# Patient Record
Sex: Female | Born: 1995 | Race: White | Hispanic: No | Marital: Single | Smoking: Former smoker
Health system: Southern US, Community
[De-identification: ages and names within clinical notes are randomized; demographics above are authoritative.]

## PROBLEM LIST (undated history)

## (undated) ENCOUNTER — Inpatient Hospital Stay: Payer: Self-pay

## (undated) DIAGNOSIS — F32A Depression, unspecified: Secondary | ICD-10-CM

## (undated) DIAGNOSIS — F329 Major depressive disorder, single episode, unspecified: Secondary | ICD-10-CM

## (undated) DIAGNOSIS — F419 Anxiety disorder, unspecified: Secondary | ICD-10-CM

## (undated) HISTORY — PX: NO PAST SURGERIES: SHX2092

---

## 2014-05-13 NOTE — L&D Delivery Note (Signed)
Delivery Note At 3:26 PM a viable female was delivered via Vaginal, Spontaneous Delivery (Presentation: ; Occiput Anterior).  APGAR: 9, 9; weight pending .   Placenta status: Intact, Spontaneous.  Cord: 3 vessels with the following complications: none.  Cord pH: not obtained  Anesthesia: Epidural  Episiotomy: None Lacerations: 2nd degree Suture Repair: 3.0 vicryl Est. Blood Loss (mL): 400  Mom to postpartum.  Baby to Couplet care / Skin to Skin.  Called to see patient.  Mom pushed to delivery viable female infant.  The head followed by shoulders, which delivered without difficulty, and the rest of the body.  No nuchal cord noted.  Baby to mom's chest.  Cord clamped and cut after > 1 min delay.  Cord blood obtained.  Placenta delivered spontaneously, intact, with a 3-vessel cord.  Second degree vaginal/perineal laceration repaired with 3-0 Vicryl in standard fashion.  All counts correct.  Hemostasis obtained with IV pitocin and fundal massage. EBL 400 mL.   Girl name is Kristen Hughes.   Conard Novak, MD 12/22/2014, 3:46 PM

## 2014-05-25 LAB — OB RESULTS CONSOLE ABO/RH: RH TYPE: POSITIVE

## 2014-05-25 LAB — OB RESULTS CONSOLE GC/CHLAMYDIA
CHLAMYDIA, DNA PROBE: NEGATIVE
Gonorrhea: NEGATIVE

## 2014-05-25 LAB — OB RESULTS CONSOLE RUBELLA ANTIBODY, IGM: Rubella: IMMUNE

## 2014-05-25 LAB — OB RESULTS CONSOLE HEPATITIS B SURFACE ANTIGEN: Hepatitis B Surface Ag: NEGATIVE

## 2014-05-25 LAB — OB RESULTS CONSOLE VARICELLA ZOSTER ANTIBODY, IGG: Varicella: IMMUNE

## 2014-05-25 LAB — OB RESULTS CONSOLE HIV ANTIBODY (ROUTINE TESTING): HIV: NONREACTIVE

## 2014-05-25 LAB — OB RESULTS CONSOLE RPR: RPR: NONREACTIVE

## 2014-05-31 ENCOUNTER — Ambulatory Visit: Payer: Self-pay | Admitting: Advanced Practice Midwife

## 2014-09-29 ENCOUNTER — Inpatient Hospital Stay
Admission: EM | Admit: 2014-09-29 | Discharge: 2014-09-30 | DRG: 778 | Disposition: A | Discharge status: Other Acute Inpatient Hospital | Payer: 59 | Attending: Obstetrics and Gynecology | Admitting: Obstetrics and Gynecology

## 2014-09-29 DIAGNOSIS — O4700 False labor before 37 completed weeks of gestation, unspecified trimester: Secondary | ICD-10-CM | POA: Diagnosis present

## 2014-09-29 DIAGNOSIS — O479 False labor, unspecified: Secondary | ICD-10-CM | POA: Diagnosis present

## 2014-09-29 DIAGNOSIS — O99332 Smoking (tobacco) complicating pregnancy, second trimester: Secondary | ICD-10-CM | POA: Diagnosis present

## 2014-09-29 DIAGNOSIS — F17210 Nicotine dependence, cigarettes, uncomplicated: Secondary | ICD-10-CM | POA: Diagnosis present

## 2014-09-29 DIAGNOSIS — Z880 Allergy status to penicillin: Secondary | ICD-10-CM | POA: Diagnosis not present

## 2014-09-29 DIAGNOSIS — Z3A27 27 weeks gestation of pregnancy: Secondary | ICD-10-CM | POA: Diagnosis present

## 2014-09-29 DIAGNOSIS — O4703 False labor before 37 completed weeks of gestation, third trimester: Secondary | ICD-10-CM

## 2014-09-29 DIAGNOSIS — Z3490 Encounter for supervision of normal pregnancy, unspecified, unspecified trimester: Secondary | ICD-10-CM

## 2014-09-29 LAB — FETAL FIBRONECTIN: Fetal Fibronectin: NEGATIVE

## 2014-09-29 LAB — URINALYSIS COMPLETE WITH MICROSCOPIC (ARMC ONLY)
Bacteria, UA: NONE SEEN
Bilirubin Urine: NEGATIVE
Glucose, UA: NEGATIVE mg/dL
Hgb urine dipstick: NEGATIVE
NITRITE: NEGATIVE
PH: 6 (ref 5.0–8.0)
Protein, ur: NEGATIVE mg/dL
SPECIFIC GRAVITY, URINE: 1.008 (ref 1.005–1.030)

## 2014-09-29 LAB — CBC WITH DIFFERENTIAL/PLATELET
BASOS ABS: 0 10*3/uL (ref 0–0.1)
BASOS PCT: 0 %
Eosinophils Absolute: 0.2 10*3/uL (ref 0–0.7)
Eosinophils Relative: 1 %
HCT: 39.5 % (ref 35.0–47.0)
Hemoglobin: 13 g/dL (ref 12.0–16.0)
LYMPHS PCT: 14 %
Lymphs Abs: 2.1 10*3/uL (ref 1.0–3.6)
MCH: 31.3 pg (ref 26.0–34.0)
MCHC: 32.9 g/dL (ref 32.0–36.0)
MCV: 95.2 fL (ref 80.0–100.0)
Monocytes Absolute: 1 10*3/uL — ABNORMAL HIGH (ref 0.2–0.9)
Monocytes Relative: 7 %
NEUTROS ABS: 11.4 10*3/uL — AB (ref 1.4–6.5)
NEUTROS PCT: 78 %
Platelets: 224 10*3/uL (ref 150–440)
RBC: 4.15 MIL/uL (ref 3.80–5.20)
RDW: 13 % (ref 11.5–14.5)
WBC: 14.7 10*3/uL — AB (ref 3.6–11.0)

## 2014-09-29 LAB — CHLAMYDIA/NGC RT PCR (ARMC ONLY)
Chlamydia Tr: NOT DETECTED
N gonorrhoeae: NOT DETECTED

## 2014-09-29 MED ORDER — PRENATAL MULTIVITAMIN CH
1.0000 | ORAL_TABLET | Freq: Every day | ORAL | Status: DC
  Filled 2014-09-29 (×2): qty 1

## 2014-09-29 MED ORDER — BETAMETHASONE SOD PHOS & ACET 6 (3-3) MG/ML IJ SUSP
12.0000 mg | Freq: Every day | INTRAMUSCULAR | Status: DC
  Administered 2014-09-29: 12 mg via INTRAMUSCULAR
  Filled 2014-09-29: qty 2

## 2014-09-29 MED ORDER — CITRIC ACID-SODIUM CITRATE 334-500 MG/5ML PO SOLN: 30.0000 mL | ORAL | Status: DC | PRN

## 2014-09-29 MED ORDER — LACTATED RINGERS IV BOLUS (SEPSIS)
1000.0000 mL | Freq: Once | INTRAVENOUS | Status: AC
  Administered 2014-09-29: 1000 mL via INTRAVENOUS

## 2014-09-29 MED ORDER — LACTATED RINGERS IV SOLN
INTRAVENOUS | Status: DC
  Administered 2014-09-29 – 2014-09-30 (×2): via INTRAVENOUS

## 2014-09-29 MED ORDER — LIDOCAINE HCL (PF) 1 % IJ SOLN
30.0000 mL | INTRAMUSCULAR | Status: DC | PRN
  Filled 2014-09-29: qty 30

## 2014-09-29 MED ORDER — ONDANSETRON HCL 4 MG/2ML IJ SOLN: 4.0000 mg | Freq: Four times a day (QID) | INTRAMUSCULAR | Status: DC | PRN

## 2014-09-29 MED ORDER — BETAMETHASONE SOD PHOS & ACET 6 (3-3) MG/ML IJ SUSP
INTRAMUSCULAR | Status: AC
  Administered 2014-09-29: 12 mg via INTRAMUSCULAR
  Filled 2014-09-29: qty 1

## 2014-09-29 MED ORDER — ACETAMINOPHEN 325 MG PO TABS: 650.0000 mg | ORAL_TABLET | ORAL | Status: DC | PRN

## 2014-09-29 NOTE — H&P (Signed)
Obstetrics Admission History & Physical  Primary OBGYN: [facility]  Chief Complaint:  UCs  History of Present Illness  19 y.o. G1P0 @ 3474w2d (Dating: 9wk u/s, EDC 8/16 per patient). Pregnancy complicated by: 1/4 ppd tobacco use.  Ms. Sherre PootLogan Briana Cange states that starting today she started noticing belly cramping that turned into q3-4531m UCs, which is what brought her in. No VB, LOF, decreased FM. Last anything in the vagina in the past two days was a q-tip (no speculum) at her PNV yesterday for screening STIs. Also no fevers, dysuria  Review of Systems:  her 12 point review of systems is negative or as noted in the History of Present Illness.  PMHx: History reviewed. No pertinent past medical history. PSHx: History reviewed. No pertinent past surgical history. Medications:  No prescriptions prior to admission     Allergies: is allergic to amoxicillin.-->hives OBHx: as above GYNHx:            FHx: History reviewed. No pertinent family history. Soc Hx:  History   Social History  . Marital Status: Single    Spouse Name: N/A  . Number of Children: N/A  . Years of Education: N/A   Occupational History  . Not on file.   Social History Main Topics  . Smoking status: Current Every Day Smoker -- 0.25 packs/day for 5 years    Types: Cigarettes  . Smokeless tobacco: Never Used  . Alcohol Use: No  . Drug Use: No  . Sexual Activity: Yes   Other Topics Concern  . Not on file   Social History Narrative  . No narrative on file    Objective    Current Vital Signs 24h Vital Sign Ranges  T 98.9 F (37.2 C) Temp  Avg: 98.9 F (37.2 C)  Min: 98.9 F (37.2 C)  Max: 98.9 F (37.2 C)  BP 113/67 mmHg BP  Min: 113/67  Max: 113/67  HR 89 Pulse  Avg: 89  Min: 89  Max: 89  RR 18 Resp  Avg: 18  Min: 18  Max: 18  SaO2    >98/RA No Data Recorded       24 Hour I/O Current Shift I/O  Time Ins Outs       EFM: 145 baseline, +accels, no decels (1712 was during speculum exam), mod var Toco:  q1-3036m  General: Well nourished, well developed female in no acute distress. No distress with UCs Skin:  Warm and dry.  Cardiovascular: Regular rate and rhythm. Respiratory:  Clear to auscultation bilateral. Normal respiratory effort Abdomen: gravid, nttp Neuro/Psych:  Normal mood and affect.   SSE: no pooling, cx visually closed, no discharge SVE: 1-1.5/50/-3/intermediate/mid position  Labs  Wet prep negative  Assessment & Plan   19 y.o. G1P0 @ 2874w2d with possible PTL, pt stable *IUP: category I, fetal status reassuring *PTL: FFN sent and ordered BMZ U/A, UCx, CBC, type and screen and IVF bolus. Follow up labs. If negative and SVE negative, admit to [facility] for close monitoring and rpt SVE and discharge after BMZ#2. If FFN positive, UCs become painful and/or with cx change, will start tocolysis and transfer to tertiary care center. Will do bedside u/s *Analgesia: no needs  Cornelia Copaharlie Gaylon Bentz, Jr. MD [facility] Pager [phone]

## 2014-09-29 NOTE — Progress Notes (Signed)
OB Triage note  Cervix unchanged, BSUS cepahlic and fluid looks subjectively normal.  Pt feeling no UCs and just infrequent cramps and toco appears to show less frequent UCs and just irritability. FFN negative.   Recent Labs Lab 09/29/14 1746  WBC 14.7*  HGB 13.0  HCT 39.5  PLT 224    With mild white count and left shift and pt just voided.  Will follow up urine studies and will leave on until IVF bolus is done. If still looks good, then okay to transfer to [facility]. Told pt that if SVE is unchanged after bmz#2 tomorrow, then okay to go home and pt told to let us know about any change in s/s. Fetus still category I

## 2014-09-29 NOTE — Discharge Summary (Signed)
Discharge Summary   Admit Date: 09/29/2014 Discharge Date: 09/30/2014 Discharging Service: Antepartum  Primary OBGYN: [facility] Admitting Physician: [place] Bingharlie Renlee Floor, MD  Discharge Physician: Same  Referring Provider: Self  Primary Care Provider: [facility]  Admission Diagnoses (Primary):  *IUP @ 27/2 *Preterm contractions *Cervical dilation   Discharge Diagnoses (Primary):  *IUP @ 27/3 *Preterm labor  Consult Orders: None   Surgeries/Procedures Performed: none  History and Physical: 19 y.o. G1P0 @ 4775w2d (Dating: 9wk u/s, EDC 8/16 per patient). Pregnancy complicated by: 1/4 ppd tobacco use.  Ms. Sherre PootLogan Briana Stimmel states that starting today she started noticing belly cramping that turned into q3-6740m UCs, which is what brought her in. No VB, LOF, decreased FM. Last anything in the vagina in the past two days was a q-tip (no speculum) at her PNV yesterday for screening STIs. Also no fevers, dysuria SVE 1-1.5/50/-3/intermediate/mid position x 2, FFN negative, negative speculum exam, admit CBC below with slight white shift.   Recent Labs Lab 09/29/14 1746  WBC 14.7*  HGB 13.0  HCT 39.5  PLT 224   Given exam and pt stable, decision made to obs until bmz#2 and rpt SVE and if stable to d/c to home.  Hospital Course: Patient started having recurrence of s/s at 0530 with regular UCs on monitor with SVE now 2-2.5/50/-2/intermediate/midposition. Mg 6/2 started, as well as ancef. U/a neg and UCx pending on transfer with GC/CT urine negative. [facility]. BMZ #1 given on 5/19 at 1800.  [facility] Dr[facility] Leatha Gilding[place]ston  Discharge Exam:  as above  Abd: NTTP  Discharge Disposition:  stable   Results Pending at Discharge:  Ucx  Discharge Medications:   Medication List    STOP taking these medications        prenatal multivitamin Tabs tablet      TAKE these medications        lactated ringers infusion  Inject 75 mLs into the vein continuous.     magnesium sulfate 40 g in lactated ringers 420 mL  Inject 2 g/hr into the vein continuous.        Cornelia Copaharlie Lilli Dewald, Jr. MD [facility] Pager [phone]

## 2014-09-30 LAB — ABO/RH: ABO/RH(D): O POS

## 2014-09-30 LAB — TYPE AND SCREEN
ABO/RH(D): O POS
Antibody Screen: NEGATIVE

## 2014-09-30 MED ORDER — LACTATED RINGERS IV SOLN
2.0000 g/h | INTRAVENOUS | Status: DC
  Filled 2014-09-30 (×2): qty 80

## 2014-09-30 MED ORDER — MAGNESIUM SULFATE 50 % IJ SOLN: 2.0000 g/h | INTRAVENOUS | Status: DC

## 2014-09-30 MED ORDER — LACTATED RINGERS IV SOLN: INTRAVENOUS | Status: DC

## 2014-09-30 MED ORDER — HYDRALAZINE HCL 20 MG/ML IJ SOLN: 10.0000 mg | Freq: Once | INTRAMUSCULAR | Status: DC | PRN

## 2014-09-30 MED ORDER — CEFAZOLIN SODIUM-DEXTROSE 2-3 GM-% IV SOLR
INTRAVENOUS | Status: AC
  Administered 2014-09-30: 2 g via INTRAVENOUS
  Filled 2014-09-30: qty 50

## 2014-09-30 MED ORDER — CEFAZOLIN SODIUM-DEXTROSE 2-3 GM-% IV SOLR
2.0000 g | Freq: Once | INTRAVENOUS | Status: AC
  Administered 2014-09-30: 2 g via INTRAVENOUS

## 2014-09-30 MED ORDER — LACTATED RINGERS IV SOLN: 75.0000 mL | INTRAVENOUS | Status: DC

## 2014-09-30 MED ORDER — CEFAZOLIN SODIUM 1-5 GM-% IV SOLN: 1.0000 g | Freq: Three times a day (TID) | INTRAVENOUS | Status: DC

## 2014-09-30 MED ORDER — MAGNESIUM SULFATE BOLUS VIA INFUSION
6.0000 g | Freq: Once | INTRAVENOUS | Status: AC
  Administered 2014-09-30: 6 g via INTRAVENOUS

## 2014-09-30 MED ORDER — LABETALOL HCL 5 MG/ML IV SOLN: 20.0000 mg | INTRAVENOUS | Status: DC | PRN

## 2014-09-30 NOTE — Progress Notes (Addendum)
L&D Note  09/30/2014 - 7:17 AM  19 y.o. G1P0 1412w3d (9wk u/s). HD#2 admitted for cervical dilation and preterm UCs  Overnight: pt started feeling q1-3 UCs at around 0530. Pt moved over to L&D with continued s/s; no VB, LOF   Subjective:  As above  Objective:    Current Vital Signs 24h Vital Sign Ranges  T 98.7 F (37.1 C) Temp  Avg: 98.7 F (37.1 C)  Min: 98.4 F (36.9 C)  Max: 98.9 F (37.2 C)  BP 119/73 mmHg BP  Min: 104/57  Max: 120/62  HR 95 Pulse  Avg: 90.8  Min: 79  Max: 96  RR 18 Resp  Avg: 18  Min: 18  Max: 18  SaO2 97 %   SpO2  Avg: 97.3 %  Min: 97 %  Max: 98 %       24 Hour I/O Current Shift I/O  Time Ins Outs 05/19 0701 - 05/20 0700 In: 2331.3 [I.V.:1331.3] Out: 1900 [Urine:1900]      FHR: 150 baseline, +accels, no decels, mod variabilit Toco: q1-598m Gen: NAD SVE: 2cm/50/-2/midposition/intermediate (was 1-1.5/50 on admission) Abd: NTTP   Recent Labs Lab 09/29/14 1746  WBC 14.7*  HGB 13.0  HCT 39.5  PLT 224   O POS GC/CT urine negative Results for Kristen Hughes, Kristen Hughes (MRN [id number]) as of 09/30/2014 07:19  Ref. Range 09/29/2014 18:50  Appearance Latest Ref Range: CLEAR  CLEAR (A)  Bacteria, UA Latest Ref Range: NONE SEEN  NONE SEEN  Bilirubin Urine Latest Ref Range: NEGATIVE  NEGATIVE  Color, Urine Latest Ref Range: YELLOW  STRAW (A)  Glucose Latest Ref Range: NEGATIVE mg/dL NEGATIVE  Hgb urine dipstick Latest Ref Range: NEGATIVE  NEGATIVE  Ketones, ur Latest Ref Range: NEGATIVE mg/dL 1+ (A)  Leukocytes, UA Latest Ref Range: NEGATIVE  1+ (A)  Mucous Unknown PRESENT  Nitrite Latest Ref Range: NEGATIVE  NEGATIVE  pH Latest Ref Range: 5.0-8.0  6.0  Protein Latest Ref Range: NEGATIVE mg/dL NEGATIVE  RBC / HPF Latest Ref Range: 0-5 RBC/hpf 0-5  Specific Gravity, Urine Latest Ref Range: 1.005-1.030  1.008  Squamous Epithelial / LPF Latest Ref Range: NONE SEEN  0-5 (A)  WBC, UA Latest Ref Range: 0-5 WBC/hpf 0-5  UCx pending   Assessment & Plan:   PTL *IUP: category I tracing *PTL: start Mg 6/2 and ancef, given h/o hives with PCN. Also d/w pt regarding need for transfer to tertiary care center. [facility]. Pt amenable to plan. Due for bmz#2 1800 today. Will let nicu know in case progresses quickly but low concern for that. Was cephalic on admission and still feels that way; if progress will re scan *GBS: unknown; preterm so will start amp *Analgesia: no needs  English Bingharlie Lamanda Rudder, Montez HagemanJr MD [facility] Pager [phone]

## 2014-09-30 NOTE — Plan of Care (Signed)
[facility] to Dr. Jimmey RalphElizabeth Livingston. Pt leaving dept in stable condition via stretcher. Pt is on Mag 2 gram IV and LR 2075ml/hr. Complete report given to transfer RN team.

## 2014-10-01 LAB — URINE CULTURE

## 2014-11-20 ENCOUNTER — Observation Stay
Admission: EM | Admit: 2014-11-20 | Discharge: 2014-11-20 | Disposition: A | Discharge status: Home / Self Care | Payer: Medicaid Other | Attending: Certified Nurse Midwife | Admitting: Certified Nurse Midwife

## 2014-11-20 DIAGNOSIS — Z87891 Personal history of nicotine dependence: Secondary | ICD-10-CM | POA: Diagnosis not present

## 2014-11-20 DIAGNOSIS — O368130 Decreased fetal movements, third trimester, not applicable or unspecified: Secondary | ICD-10-CM | POA: Diagnosis present

## 2014-11-20 DIAGNOSIS — Z3A34 34 weeks gestation of pregnancy: Secondary | ICD-10-CM | POA: Insufficient documentation

## 2014-11-20 LAB — URINALYSIS COMPLETE WITH MICROSCOPIC (ARMC ONLY)
Bilirubin Urine: NEGATIVE
GLUCOSE, UA: NEGATIVE mg/dL
HGB URINE DIPSTICK: NEGATIVE
Ketones, ur: NEGATIVE mg/dL
NITRITE: NEGATIVE
PROTEIN: NEGATIVE mg/dL
Specific Gravity, Urine: 1.004 — ABNORMAL LOW (ref 1.005–1.030)
pH: 6 (ref 5.0–8.0)

## 2014-11-20 MED ORDER — TERBUTALINE SULFATE 1 MG/ML IJ SOLN
0.2500 mg | Freq: Once | INTRAMUSCULAR | Status: AC
  Administered 2014-11-20: 0.25 mg via SUBCUTANEOUS
  Filled 2014-11-20: qty 1

## 2014-11-20 MED ORDER — CEPHALEXIN 500 MG PO CAPS
500.0000 mg | ORAL_CAPSULE | Freq: Once | ORAL | Status: AC
  Administered 2014-11-20: 500 mg via ORAL
  Filled 2014-11-20: qty 1

## 2014-11-20 NOTE — Progress Notes (Addendum)
Triage progress note  S: Feeling lots of FM now  O: 126/76 Contractions: absent after terbutaline FHR baseline 150s with accels to 180s (baby very active now) Cervix: no change  UA revealed +2 leukocytes, 6-30 WBC, 6-30 sq cells. Sp grav=1.004  A: IUP at 34 5/7 weeks with reactive NST and resolved preterm contractions Possible UTI  P: Start Keflex 500 mgm tid x 7 days Urine culture  pending Call for results in 3 days DC [facility] as scheduled Labor precautions Activity restrictions: Pelvic rest. No prolonged standing or walking  Marticia Reifschneider, CNM

## 2014-11-20 NOTE — Plan of Care (Signed)
Keflex given PO. Patient reports feeling cramping again; rates 4/10. Readjust toco. Report to night shift & C. Sharen HonesGutierrez, CNM. Loyola MastKaren R Ruthe Roemer, RN

## 2014-11-20 NOTE — Progress Notes (Signed)
Triage progress Note.  C/C: Has not felt baby move today. Has tried eating, lying down  19 year old G1 P0 with EDC=12/27/2014 by LMP=03/22/2014 presents at 34 wk 5 days with reports of decreased FM and was found to be contracting frequently. Prenatal care is remarkable for a 9 week ultrasound confirming dates (-5 days), an anxiety disorder (was on Paxil  Until +UPT)), scabies, and preterm labor at 27 weeks. She was placed on magnesium and [facility]. She received BMZ x2. She reports that she was 2 cm dilated when she was checked last. She has also been treated for Chlamydia this pregnancy in Jan with a negative TOC. She had a positive MJ UDS and is a former cigarette smoker.  Current meds: PNV Allergies: Amoxicillin: hives  She denies dysuria, foul odor to urine, or any vulvar itching or irritation. She has been eating and drinking today, no nausea, vomiting, or diarrhea.  Exam: 124/72 Pulse 94.  General: in NAD  Abdomen: soft, NT, Cephalic on Leopold's Contractions: q2-6 minutes apart FHR: 140s with accelerations to 160s to 170s, moderate variability CX: 2/70%/-2 Wet prep: negative hyphae, Trich, clue cells  A: IUP at 34wk5days with preterm contractions Reactive NST  P:Urinalysis Terbutaline 0.25 mgm x1 subcut PO fluids Recheck cervix in 1-2 hours  Kristen Hughes, CNM

## 2014-11-20 NOTE — OB Triage Note (Signed)
Patient reports not feeling baby move all day. Denies contractions, loss of fluid or bleeding. EFM applied. History of hospitalization for preterm labor 09/30/2014. Betamethasone x2 with that admission. Denies any other episodes. Last known cervical dilation 2 cm. Loyola MastKaren R Kerman Pfost, RN

## 2014-11-22 LAB — URINE CULTURE

## 2014-11-30 LAB — OB RESULTS CONSOLE GC/CHLAMYDIA
CHLAMYDIA, DNA PROBE: NEGATIVE
GC PROBE AMP, GENITAL: NEGATIVE

## 2014-11-30 LAB — OB RESULTS CONSOLE GBS: STREP GROUP B AG: NEGATIVE

## 2014-12-22 ENCOUNTER — Inpatient Hospital Stay: Payer: Medicaid Other | Admitting: Anesthesiology

## 2014-12-22 ENCOUNTER — Inpatient Hospital Stay
Admission: EM | Admit: 2014-12-22 | Discharge: 2014-12-24 | DRG: 774 | Disposition: A | Discharge status: Home / Self Care | Payer: Medicaid Other | Attending: Obstetrics & Gynecology | Admitting: Obstetrics & Gynecology

## 2014-12-22 DIAGNOSIS — O99323 Drug use complicating pregnancy, third trimester: Secondary | ICD-10-CM | POA: Diagnosis present

## 2014-12-22 DIAGNOSIS — A749 Chlamydial infection, unspecified: Secondary | ICD-10-CM | POA: Diagnosis present

## 2014-12-22 DIAGNOSIS — E669 Obesity, unspecified: Secondary | ICD-10-CM | POA: Diagnosis present

## 2014-12-22 DIAGNOSIS — O99344 Other mental disorders complicating childbirth: Secondary | ICD-10-CM | POA: Diagnosis present

## 2014-12-22 DIAGNOSIS — Z6837 Body mass index (BMI) 37.0-37.9, adult: Secondary | ICD-10-CM | POA: Diagnosis not present

## 2014-12-22 DIAGNOSIS — O9882 Other maternal infectious and parasitic diseases complicating childbirth: Secondary | ICD-10-CM | POA: Diagnosis present

## 2014-12-22 DIAGNOSIS — F419 Anxiety disorder, unspecified: Secondary | ICD-10-CM | POA: Diagnosis present

## 2014-12-22 DIAGNOSIS — F1290 Cannabis use, unspecified, uncomplicated: Secondary | ICD-10-CM | POA: Diagnosis present

## 2014-12-22 DIAGNOSIS — Z3490 Encounter for supervision of normal pregnancy, unspecified, unspecified trimester: Secondary | ICD-10-CM

## 2014-12-22 DIAGNOSIS — Z3A39 39 weeks gestation of pregnancy: Secondary | ICD-10-CM | POA: Diagnosis not present

## 2014-12-22 DIAGNOSIS — Z87891 Personal history of nicotine dependence: Secondary | ICD-10-CM | POA: Diagnosis not present

## 2014-12-22 DIAGNOSIS — F329 Major depressive disorder, single episode, unspecified: Secondary | ICD-10-CM | POA: Diagnosis present

## 2014-12-22 HISTORY — DX: Anxiety disorder, unspecified: F41.9

## 2014-12-22 HISTORY — DX: Depression, unspecified: F32.A

## 2014-12-22 HISTORY — DX: Major depressive disorder, single episode, unspecified: F32.9

## 2014-12-22 LAB — CBC
HCT: 38.5 % (ref 35.0–47.0)
Hemoglobin: 13.1 g/dL (ref 12.0–16.0)
MCH: 31.8 pg (ref 26.0–34.0)
MCHC: 33.9 g/dL (ref 32.0–36.0)
MCV: 93.7 fL (ref 80.0–100.0)
PLATELETS: 185 10*3/uL (ref 150–440)
RBC: 4.11 MIL/uL (ref 3.80–5.20)
RDW: 12.8 % (ref 11.5–14.5)
WBC: 15.9 10*3/uL — AB (ref 3.6–11.0)

## 2014-12-22 LAB — URINE DRUG SCREEN, QUALITATIVE (ARMC ONLY)
Amphetamines, Ur Screen: NOT DETECTED
Barbiturates, Ur Screen: NOT DETECTED
Benzodiazepine, Ur Scrn: NOT DETECTED
Cannabinoid 50 Ng, Ur ~~LOC~~: NOT DETECTED
Cocaine Metabolite,Ur ~~LOC~~: NOT DETECTED
MDMA (Ecstasy)Ur Screen: NOT DETECTED
Methadone Scn, Ur: NOT DETECTED
Opiate, Ur Screen: NOT DETECTED
PHENCYCLIDINE (PCP) UR S: NOT DETECTED
Tricyclic, Ur Screen: NOT DETECTED

## 2014-12-22 LAB — CHLAMYDIA/NGC RT PCR (ARMC ONLY)
Chlamydia Tr: NOT DETECTED
N gonorrhoeae: NOT DETECTED

## 2014-12-22 LAB — TYPE AND SCREEN
ABO/RH(D): O POS
Antibody Screen: NEGATIVE

## 2014-12-22 MED ORDER — LIDOCAINE HCL (PF) 1 % IJ SOLN: 30.0000 mL | INTRAMUSCULAR | Status: DC | PRN

## 2014-12-22 MED ORDER — SIMETHICONE 80 MG PO CHEW: 80.0000 mg | CHEWABLE_TABLET | ORAL | Status: DC | PRN

## 2014-12-22 MED ORDER — LIDOCAINE HCL (PF) 1 % IJ SOLN
30.0000 mL | INTRAMUSCULAR | Status: DC | PRN
  Filled 2014-12-22: qty 30

## 2014-12-22 MED ORDER — ONDANSETRON HCL 4 MG/2ML IJ SOLN: 4.0000 mg | INTRAMUSCULAR | Status: DC | PRN

## 2014-12-22 MED ORDER — OXYTOCIN 40 UNITS IN LACTATED RINGERS INFUSION - SIMPLE MED
1.0000 m[IU]/min | INTRAVENOUS | Status: DC
  Administered 2014-12-22: 1 m[IU]/min via INTRAVENOUS

## 2014-12-22 MED ORDER — FENTANYL 2.5 MCG/ML W/ROPIVACAINE 0.2% IN NS 100 ML EPIDURAL INFUSION (ARMC-ANES)
10.0000 mL/h | EPIDURAL | Status: DC
  Administered 2014-12-22: 10 mL/h via EPIDURAL

## 2014-12-22 MED ORDER — OXYTOCIN 40 UNITS IN LACTATED RINGERS INFUSION - SIMPLE MED
INTRAVENOUS | Status: AC
  Administered 2014-12-22: 1 m[IU]/min via INTRAVENOUS
  Filled 2014-12-22: qty 1000

## 2014-12-22 MED ORDER — AMMONIA AROMATIC IN INHA
RESPIRATORY_TRACT | Status: AC
  Filled 2014-12-22: qty 10

## 2014-12-22 MED ORDER — ONDANSETRON HCL 4 MG/2ML IJ SOLN: 4.0000 mg | Freq: Four times a day (QID) | INTRAMUSCULAR | Status: DC | PRN

## 2014-12-22 MED ORDER — LACTATED RINGERS IV SOLN
INTRAVENOUS | Status: DC
  Administered 2014-12-22 (×2): 125 mL/h via INTRAVENOUS

## 2014-12-22 MED ORDER — PHENYLEPHRINE 40 MCG/ML (10ML) SYRINGE FOR IV PUSH (FOR BLOOD PRESSURE SUPPORT)
80.0000 ug | PREFILLED_SYRINGE | INTRAVENOUS | Status: DC | PRN
  Filled 2014-12-22: qty 2

## 2014-12-22 MED ORDER — TERBUTALINE SULFATE 1 MG/ML IJ SOLN
0.2500 mg | Freq: Once | INTRAMUSCULAR | Status: DC | PRN
  Filled 2014-12-22: qty 1

## 2014-12-22 MED ORDER — ACETAMINOPHEN 325 MG PO TABS: 650.0000 mg | ORAL_TABLET | ORAL | Status: DC | PRN

## 2014-12-22 MED ORDER — PRENATAL MULTIVITAMIN CH
1.0000 | ORAL_TABLET | Freq: Every day | ORAL | Status: DC
  Administered 2014-12-23 – 2014-12-24 (×2): 1 via ORAL
  Filled 2014-12-22 (×2): qty 1

## 2014-12-22 MED ORDER — BENZOCAINE-MENTHOL 20-0.5 % EX AERO
1.0000 "application " | INHALATION_SPRAY | CUTANEOUS | Status: DC | PRN
  Administered 2014-12-24: 1 via TOPICAL
  Filled 2014-12-22 (×2): qty 56

## 2014-12-22 MED ORDER — FENTANYL CITRATE (PF) 100 MCG/2ML IJ SOLN
50.0000 ug | INTRAMUSCULAR | Status: DC | PRN
  Administered 2014-12-22 (×2): 50 ug via INTRAVENOUS

## 2014-12-22 MED ORDER — DIBUCAINE 1 % RE OINT
1.0000 | TOPICAL_OINTMENT | RECTAL | Status: DC | PRN
Start: 2014-12-22 — End: 2014-12-24

## 2014-12-22 MED ORDER — WITCH HAZEL-GLYCERIN EX PADS: 1.0000 "application " | MEDICATED_PAD | CUTANEOUS | Status: DC | PRN

## 2014-12-22 MED ORDER — CITRIC ACID-SODIUM CITRATE 334-500 MG/5ML PO SOLN: 30.0000 mL | ORAL | Status: DC | PRN

## 2014-12-22 MED ORDER — DIPHENHYDRAMINE HCL 25 MG PO CAPS: 25.0000 mg | ORAL_CAPSULE | Freq: Four times a day (QID) | ORAL | Status: DC | PRN

## 2014-12-22 MED ORDER — EPHEDRINE 5 MG/ML INJ
10.0000 mg | INTRAVENOUS | Status: DC | PRN
  Filled 2014-12-22: qty 2

## 2014-12-22 MED ORDER — IBUPROFEN 600 MG PO TABS
600.0000 mg | ORAL_TABLET | Freq: Four times a day (QID) | ORAL | Status: DC
  Administered 2014-12-23 – 2014-12-24 (×7): 600 mg via ORAL
  Filled 2014-12-22 (×7): qty 1

## 2014-12-22 MED ORDER — BUPIVACAINE HCL (PF) 0.25 % IJ SOLN
INTRAMUSCULAR | Status: DC | PRN
  Administered 2014-12-22: 5 mL

## 2014-12-22 MED ORDER — FENTANYL 2.5 MCG/ML W/ROPIVACAINE 0.2% IN NS 100 ML EPIDURAL INFUSION (ARMC-ANES)
EPIDURAL | Status: AC
  Filled 2014-12-22: qty 100

## 2014-12-22 MED ORDER — OXYTOCIN 40 UNITS IN LACTATED RINGERS INFUSION - SIMPLE MED: 62.5000 mL/h | INTRAVENOUS | Status: DC | PRN

## 2014-12-22 MED ORDER — LACTATED RINGERS IV SOLN: INTRAVENOUS | Status: DC

## 2014-12-22 MED ORDER — OXYTOCIN 10 UNIT/ML IJ SOLN
INTRAMUSCULAR | Status: AC
  Filled 2014-12-22: qty 2

## 2014-12-22 MED ORDER — FENTANYL CITRATE (PF) 100 MCG/2ML IJ SOLN
INTRAMUSCULAR | Status: AC
  Administered 2014-12-22: 50 ug via INTRAVENOUS
  Filled 2014-12-22: qty 2

## 2014-12-22 MED ORDER — HYDROCODONE-ACETAMINOPHEN 5-325 MG PO TABS: 2.0000 | ORAL_TABLET | ORAL | Status: DC | PRN

## 2014-12-22 MED ORDER — FERROUS SULFATE 325 (65 FE) MG PO TABS
325.0000 mg | ORAL_TABLET | Freq: Two times a day (BID) | ORAL | Status: DC
  Administered 2014-12-23 – 2014-12-24 (×3): 325 mg via ORAL
  Filled 2014-12-22 (×3): qty 1

## 2014-12-22 MED ORDER — LIDOCAINE HCL (PF) 1 % IJ SOLN
INTRAMUSCULAR | Status: AC
  Filled 2014-12-22: qty 30

## 2014-12-22 MED ORDER — MISOPROSTOL 200 MCG PO TABS
ORAL_TABLET | ORAL | Status: AC
  Filled 2014-12-22: qty 4

## 2014-12-22 MED ORDER — LACTATED RINGERS IV SOLN: 500.0000 mL | INTRAVENOUS | Status: DC | PRN

## 2014-12-22 MED ORDER — ONDANSETRON HCL 4 MG PO TABS: 4.0000 mg | ORAL_TABLET | ORAL | Status: DC | PRN

## 2014-12-22 MED ORDER — LANOLIN HYDROUS EX OINT: TOPICAL_OINTMENT | CUTANEOUS | Status: DC | PRN

## 2014-12-22 MED ORDER — OXYTOCIN 40 UNITS IN LACTATED RINGERS INFUSION - SIMPLE MED: 62.5000 mL/h | INTRAVENOUS | Status: DC

## 2014-12-22 MED ORDER — HYDROCODONE-ACETAMINOPHEN 5-325 MG PO TABS: 1.0000 | ORAL_TABLET | ORAL | Status: DC | PRN

## 2014-12-22 MED ORDER — SENNOSIDES-DOCUSATE SODIUM 8.6-50 MG PO TABS
2.0000 | ORAL_TABLET | ORAL | Status: DC
  Administered 2014-12-23: 2 via ORAL
  Filled 2014-12-22: qty 2

## 2014-12-22 MED ORDER — DIPHENHYDRAMINE HCL 50 MG/ML IJ SOLN: 12.5000 mg | INTRAMUSCULAR | Status: DC | PRN

## 2014-12-22 MED ORDER — OXYTOCIN BOLUS FROM INFUSION
500.0000 mL | INTRAVENOUS | Status: DC
  Administered 2014-12-22: 500 mL via INTRAVENOUS

## 2014-12-22 NOTE — Progress Notes (Signed)
Pt was preparing to get up to walk.   While in the bathroom she called out and wanted me to check the fluid leaking down her legs.   Nitrazine was positive. Pt back to bed and admitted.

## 2014-12-22 NOTE — Progress Notes (Signed)
L&D Progress Note   19 year old G1 P0 at 39.2 weeks presented this AM with hx of PROM at 2330 last night. She was begun on Pitocin this AM and is currently on 8 miu/min   S: Starting to become uncomfortable. Desires pain medicine  O: BP 136/64 mmHg  Pulse 87  Temp(Src) 98.7 F (37.1 C) (Oral)  Resp 20  Ht  (1.6 m)  Wt 211 lb (95.709 kg)  BMI 37.39 kg/m2  LMP 03/22/2014 (Exact Date)  Breastfeeding? Unknown  General: in NAD, appears flushed Cervix: 4/75%/-1 Toco q 2-3, but having difficulty picking up ctxs when on side FHR 130 with accelerations to 140s to 150, moderate variability, ? Rare variable decel  A/P: Progressing with pitocin augmentation. Reassuring FH pattern Fentanyl for pain Discussed epidural option  Latera Mclin, CNM

## 2014-12-22 NOTE — OB Triage Note (Signed)
Pt states she thinks her water broke at 2330.  Small amt of clear fluid in little gushes. Some spotting noted.

## 2014-12-22 NOTE — Anesthesia Procedure Notes (Signed)
Epidural Patient location during procedure: OB  Staffing Anesthesiologist: Berdine Addison Performed by: anesthesiologist   Preanesthetic Checklist Completed: patient identified, site marked, surgical consent, pre-op evaluation, timeout performed, IV checked, risks and benefits discussed and monitors and equipment checked  Epidural Patient position: sitting Prep: Betadine Patient monitoring: heart rate, continuous pulse ox and blood pressure Approach: midline Location: L4-L5 Injection technique: LOR saline  Needle:  Needle type: Tuohy  Needle gauge: 18 G Needle length: 9 cm and 9 Catheter type: closed end flexible Catheter size: 20 Guage Test dose: negative and 1.5% lidocaine with Epi 1:200 K  Assessment Sensory level: T10 Events: blood not aspirated, injection not painful, no injection resistance, negative IV test and no paresthesia  Additional Notes   Patient tolerated the insertion well without complications. 0950 catheter placed. 1610 test dose. 0954 5 ml bolus of 0.25% marcaine.Reason for block:procedure for pain

## 2014-12-22 NOTE — Progress Notes (Signed)
Nitrazine to 3 differents areas of perineum was negative, however there appears to be clear fluid coming out of vagina.  Dr. Jean Rosenthal called and given update.     Peripad to pt on.

## 2014-12-22 NOTE — Plan of Care (Signed)
Problem: Consults Goal: Birthing Suites Patient Information Press F2 to bring up selections list  Outcome: Adequate for Discharge  Pt 37-[redacted] weeks EGA

## 2014-12-22 NOTE — H&P (Addendum)
Patient ID: Kristen Hughes, female   DOB: 09-10-1995, 19 y.o.   MRN: [id number] OB History & Physical   History of Present Illness:  Chief Complaint: water broke  HPI:  Kristen Hughes is a 19 y.o. G1P0000 female at 89w2ddated by LMP consistent with 9 weeks ultrasound.  Her pregnancy has been complicated by preterm labor at 27 weeks for which [facility] given betamethasone [facility]  [facility]. She also smokes and has used marijuana this pregnancy. She also had chlamydia this pregnancy with a negative test of cure and negative test at 36 weeks.  She has an anxiety disorder and was on Paxil until she found out she was pregnant.  .    She denies contractions.   She reports leakage of fluid.   She denies vaginal bleeding.   She reports fetal movement.    Maternal Medical History:   Past Medical History  Diagnosis Date  . Anxiety   . Depression     Past Surgical History  Procedure Laterality Date  . No past surgeries      Allergies  Allergen Reactions  . Amoxicillin Hives  . Penicillin G Hives    Prior to Admission medications   Medication Sig Start Date End Date Taking? Authorizing Provider  Prenatal Vit-Fe Fumarate-FA (MULTIVITAMIN-PRENATAL) 27-0.8 MG TABS tablet Take 1 tablet by mouth daily at 12 noon.    Historical Provider, MD    OB History: G1    [facility] care [facility] [place]  Social History: She  reports that she quit smoking about 2 months ago. Her smoking use included Cigarettes. She has a 1.25 pack-year smoking history. She has never used smokeless tobacco. She reports that she uses illicit drugs (Marijuana). She reports that she does not drink alcohol.   Family History: family history is not on file.   Review of Systems: Negative x 10 systems reviewed  except as noted in the HPI.    Physical Exam:  Vital Signs: BP 136/64 mmHg  Pulse 87  Temp(Src) 98.7 F (37.1 C) (Oral)  Resp 20  Ht 5' 3"  (1.6 m)  Wt 211 lb (95.709 kg)  BMI 37.39 kg/m2  LMP 03/22/2014 (Exact Date)  Breastfeeding? Unknown General: no acute distress.  HEENT: normocephalic, atraumatic Heart: regular rate & rhythm.  No murmurs/rubs/gallops Lungs: clear to auscultation bilaterally Abdomen: soft, gravid, non-tender;  EFW: 7.5 pounds Pelvic:   External: Normal external female genitalia  Cervix: Dilation: 2 / Effacement (%): 70 / Station: -2   ROM: grossly ruptured Extremities: non-tender, symmetric, no edema bilaterally.  DTRs: 2+  Neurologic: Alert & oriented x 3.    Pertinent Results:  Prenatal Labs: Blood type/Rh O positive  Antibody screen negative  Rubella Immune by MMR report  RPR Non-reactive  HBsAg negative  HIV negative  GC negative  Chlamydia Positive early in pregnancy, negative test of cure since that time  Genetic screening Can't find records  1 hour GTT 108  3 hour GTT n/a  GBS negative on 11/30/14   Baseline FHR: 125 beats/min   Variability: moderate   Accelerations: present   Decelerations: absent Contractions: present fr equency: irregular, infrequent Overall assessment: cat 1  Assessment:  Kristen Weylandis a 19y.o. [id number] at 315w2dith PROM.   Plan:  1.  Admit to Labor & Delivery  2. CBC, T&S, Clrs, IVF, UDS 3. GBS negative.   4. Fetwal well-being: Reassuring, category 1 5. Will augment labor with pitocin as she has not changed in over 2 hours. Expect vaginal delivery.  Total weight gain 44 pounds this pregnancy.  Will Bonnet, MD 12/22/2014 7:47 AM

## 2014-12-22 NOTE — Anesthesia Preprocedure Evaluation (Signed)
Anesthesia Evaluation  Patient identified by MRN, date of birth, ID band Patient awake    Reviewed: Allergy & Precautions, NPO status , Patient's Chart, lab work & pertinent test results, reviewed documented beta blocker date and time   Airway Mallampati: II  TM Distance: >3 FB     Dental  (+) Chipped   Pulmonary former smoker,          Cardiovascular     Neuro/Psych PSYCHIATRIC DISORDERS Anxiety Depression    GI/Hepatic   Endo/Other    Renal/GU      Musculoskeletal   Abdominal   Peds  Hematology   Anesthesia Other Findings   Reproductive/Obstetrics                             Anesthesia Physical Anesthesia Plan  ASA: II  Anesthesia Plan: Epidural   Post-op Pain Management:    Induction:   Airway Management Planned:   Additional Equipment:   Intra-op Plan:   Post-operative Plan:   Informed Consent: I have reviewed the patients History and Physical, chart, labs and discussed the procedure including the risks, benefits and alternatives for the proposed anesthesia with the patient or authorized representative who has indicated his/her understanding and acceptance.     Plan Discussed with: CRNA  Anesthesia Plan Comments:         Anesthesia Quick Evaluation

## 2014-12-23 LAB — CBC
HEMATOCRIT: 35.6 % (ref 35.0–47.0)
HEMOGLOBIN: 12 g/dL (ref 12.0–16.0)
MCH: 31.8 pg (ref 26.0–34.0)
MCHC: 33.8 g/dL (ref 32.0–36.0)
MCV: 94 fL (ref 80.0–100.0)
Platelets: 163 10*3/uL (ref 150–440)
RBC: 3.79 MIL/uL — AB (ref 3.80–5.20)
RDW: 12.8 % (ref 11.5–14.5)
WBC: 18.2 10*3/uL — ABNORMAL HIGH (ref 3.6–11.0)

## 2014-12-23 LAB — HIV ANTIBODY (ROUTINE TESTING W REFLEX): HIV SCREEN 4TH GENERATION: NONREACTIVE

## 2014-12-23 LAB — RPR: RPR: NONREACTIVE

## 2014-12-23 NOTE — Progress Notes (Signed)
  Postpartum Day 1  Subjective: no complaints, up ad lib and breastfeeding with nipple shield this am  Objective: Blood pressure 113/60, pulse 88, temperature 97.4 F (36.3 C), temperature source Oral, resp. rate 16, height  (1.6 m), weight 211 lb (95.709 kg), last menstrual period 03/22/2014, SpO2 98 %, VSS  Physical Exam:  General: alert and cooperative Lochia: appropriate Uterine Fundus: firm Incision: N/A DVT Evaluation: No evidence of DVT seen on physical exam. 3+ pedal edema Abdomen: soft, NT   Recent Labs  12/22/14 0145 12/23/14 0615  HGB 13.1 12.0  HCT 38.5 35.6    Assessment PPD #1  Plan: Discharge tomorrow and Continue PP care  Continued breastfeeding support per RN/lactation  Feeding: breast Contraception: minipill (discussed dosing/timing importance, start in 3 weeks, etc) RI/VI TDAP given in pregnancy Blood Type: O+   Marta Antu, CNM 12/23/2014, 8:56 AM

## 2014-12-23 NOTE — Anesthesia Postprocedure Evaluation (Signed)
  Anesthesia Post-op Note  Patient: Kristen Hughes  Procedure(s) Performed: * No procedures listed *  Anesthesia type:Epidural  Patient location: Floor  Post pain: Pain level controlled  Post assessment: Post-op Vital signs reviewed, Patient's Cardiovascular Status Stable, Respiratory Function Stable, Patent Airway and No signs of Nausea or vomiting  Post vital signs: Reviewed and stable  Last Vitals:  Filed Vitals:   12/23/14 0825  BP: 113/60  Pulse: 88  Temp: 36.3 C  Resp: 16    Level of consciousness: awake, alert  and patient cooperative  Complications: No apparent anesthesia complications

## 2014-12-24 MED ORDER — HYDROCODONE-ACETAMINOPHEN 5-325 MG PO TABS: 1.0000 | ORAL_TABLET | Freq: Four times a day (QID) | ORAL | Status: DC | PRN

## 2014-12-24 MED ORDER — IBUPROFEN 600 MG PO TABS: 600.0000 mg | ORAL_TABLET | Freq: Four times a day (QID) | ORAL | Status: DC | PRN

## 2014-12-24 MED ORDER — DOCUSATE SODIUM 100 MG PO CAPS
100.0000 mg | ORAL_CAPSULE | Freq: Two times a day (BID) | ORAL | Status: DC | PRN
Start: 2014-12-24 — End: 2018-10-02

## 2014-12-24 NOTE — Discharge Summary (Signed)
Obstetrical Discharge Summary  Date of Admission: 12/22/2014 Date of Discharge: 12/24/2014  Primary OB: [facility]  Gestational Age at Delivery: [redacted]w[redacted]d   Antepartum complications: h/o chlamydia with negative TOC, h/o THC use, h/o UCx with negative TOC Reason for Admission: SROM Date of Delivery: 12/22/2014  Delivered By: Dr. Thomasene Mohair MD Delivery Type: spontaneous vaginal delivery Intrapartum complications/course: None Anesthesia: epidural Placenta: spontaneous, to pathology: No. Laceration: 2nd degree Episiotomy: none Baby: Liveborn female, APGARs9/9, weight 3720 g.   Postpartum course: uncomplicated Disposition: Home  Rh Immune globulin given: not applicable Rubella vaccine given: not applicable Tdap vaccine given in AP or PP setting: yes  Contraception: OCPs to start at 4 week PP visit  Prenatal/Postnatal Panel: O POS / Rubella and varicella s/p injections x 2 / RPR negative / HIV negative / HepBsAg negative / Tdap UTD: Yes/pap needed PP / Breast / BC: OCPs; to start at 4wk PPV / Follow up: [facility]  Plan:  Kristen Hughes was discharged to home in good condition. Follow-up appointment with Dr. Jean Rosenthal in 4 weeks  Discharge Medications:   Medication List    TAKE these medications        docusate sodium 100 MG capsule  Commonly known as:  COLACE  Take 1 capsule (100 mg total) by mouth 2 (two) times daily as needed for mild constipation.     HYDROcodone-acetaminophen 5-325 MG per tablet  Commonly known as:  NORCO/VICODIN  Take 1 tablet by mouth every 6 (six) hours as needed.     ibuprofen 600 MG tablet  Commonly known as:  ADVIL,MOTRIN  Take 1 tablet (600 mg total) by mouth every 6 (six) hours as needed for mild pain or cramping.     multivitamin-prenatal 27-0.8 MG Tabs tablet  Take 1 tablet by mouth daily at 12 noon.        Cornelia Copa MD [facility]  Pager: [phone]

## 2014-12-24 NOTE — Discharge Instructions (Addendum)
° °Vaginal Delivery, Care After °Refer to this sheet in the next few weeks. These discharge instructions provide you with information on caring for yourself after delivery. Your caregiver may also give you specific instructions. Your treatment has been planned according to the most current medical practices available, but problems sometimes occur. Call your caregiver if you have any problems or questions after you go home. °HOME CARE INSTRUCTIONS °1. Take over-the-counter or prescription medicines only as directed by your caregiver or pharmacist. °2. Do not drink alcohol, especially if you are breastfeeding or taking medicine to relieve pain. °3. Do not smoke tobacco. °4. Continue to use good perineal care. Good perineal care includes: °1. Wiping your perineum from back to front °2. Keeping your perineum clean. °3. You can do sitz baths twice a day, to help keep this area clean °5. Do not use tampons, douche or have sex until your caregiver says it is okay. °6. Shower only and avoid sitting in submerged water, aside from sitz baths °7. Wear a well-fitting bra that provides breast support. °8. Eat healthy foods. °9. Drink enough fluids to keep your urine clear or pale yellow. °10. Eat high-fiber foods such as whole grain cereals and breads, brown rice, beans, and fresh fruits and vegetables every day. These foods may help prevent or relieve constipation. °11. Avoid constipation with high fiber foods or medications, such as miralax or metamucil °12. Follow your caregiver's recommendations regarding resumption of activities such as climbing stairs, driving, lifting, exercising, or traveling. °13. Talk to your caregiver about resuming sexual activities. Resumption of sexual activities is dependent upon your risk of infection, your rate of healing, and your comfort and desire to resume sexual activity. °14. Try to have someone help you with your household activities and your newborn for at least a few days after you  leave the hospital. °15. Rest as much as possible. Try to rest or take a nap when your newborn is sleeping. °16. Increase your activities gradually. °17. Keep all of your scheduled postpartum appointments. It is very important to keep your scheduled follow-up appointments. At these appointments, your caregiver will be checking to make sure that you are healing physically and emotionally. °SEEK MEDICAL CARE IF:  °· You are passing large clots from your vagina. Save any clots to show your caregiver. °· You have a foul smelling discharge from your vagina. °· You have trouble urinating. °· You are urinating frequently. °· You have pain when you urinate. °· You have a change in your bowel movements. °· You have increasing redness, pain, or swelling near your vaginal incision (episiotomy) or vaginal tear. °· You have pus draining from your episiotomy or vaginal tear. °· Your episiotomy or vaginal tear is separating. °· You have painful, hard, or reddened breasts. °· You have a severe headache. °· You have blurred vision or see spots. °· You feel sad or depressed. °· You have thoughts of hurting yourself or your newborn. °· You have questions about your care, the care of your newborn, or medicines. °· You are dizzy or light-headed. °· You have a rash. °· You have nausea or vomiting. °· You were breastfeeding and have not had a menstrual period within 12 weeks after you stopped breastfeeding. °· You are not breastfeeding and have not had a menstrual period by the 12th week after delivery. °· You have a fever. °SEEK IMMEDIATE MEDICAL CARE IF:  °· You have persistent pain. °· You have chest pain. °· You have shortness of breath. °·   You faint. °· You have leg pain. °· You have stomach pain. °· Your vaginal bleeding saturates two or more sanitary pads in 1 hour. °MAKE SURE YOU:  °· Understand these instructions. °· Will watch your condition. °· Will get help right away if you are not doing well or get worse. °Document Released:  04/26/2000 Document Revised: 09/13/2013 Document Reviewed: 12/25/2011 °ExitCare® Patient Information ©2015 [employer]. This information is not intended to replace advice given to you by your health care provider. Make sure you discuss any questions you have with your health care provider. ° °Sitz Bath °A sitz bath is a warm water bath taken in the sitting position. The water covers only the hips and butt (buttocks). We recommend using one that fits in the toilet, to help with ease of use and cleanliness. It may be used for either healing or cleaning purposes. Sitz baths are also used to relieve pain, itching, or muscle tightening (spasms). The water may contain medicine. Moist heat will help you heal and relax.  °HOME CARE  °Take 3 to 4 sitz baths a day. °18. Fill the bathtub half-full with warm water. °19. Sit in the water and open the drain a little. °20. Turn on the warm water to keep the tub half-full. Keep the water running constantly. °21. Soak in the water for 15 to 20 minutes. °22. After the sitz bath, pat the affected area dry. °GET HELP RIGHT AWAY IF: °You get worse instead of better. Stop the sitz baths if you get worse. °MAKE SURE YOU: °· Understand these instructions. °· Will watch your condition. °· Will get help right away if you are not doing well or get worse. °Document Released: 06/06/2004 Document Revised: 01/22/2012 Document Reviewed: 08/27/2010 °ExitCare® Patient Information ©2015 [employer]. This information is not intended to replace advice given to you by your health care provider. Make sure you discuss any questions you have with your health care provider. ° °Call your doctor for increased pain or vaginal bleeding, temperature above 100.4, depression, or concerns.  No strenuous activity or heavy lifting for 6 weeks.  No intercourse, tampons, douching, or enemas for 6 weeks.  No tub baths-showers only.  No driving for 2 weeks or while taking pain medications.  Continue prenatal vitamin  and iron.  Increase calories and fluids while breastfeeding.   °

## 2014-12-24 NOTE — Progress Notes (Addendum)
Daily Post Partum Note  Kristen Hughes is a 19 y.o. G1P1001 PPD#2 s/p  SVD/2nd  @ [redacted]w[redacted]d Pregnancy c/b h/o CT with neg TOC, h/o THC use, +UCx with neg TOC  24hr/overnight events:  none  Subjective:  Doing well; meeting all PP goals  Objective:    Current Vital Signs 24h Vital Sign Ranges  T 98.4 F (36.9 C) Temp  Avg: 98.2 F (36.8 C)  Min: 97.8 F (36.6 C)  Max: 98.5 F (36.9 C)  BP 112/66 mmHg BP  Min: 106/55  Max: 119/68  HR 83 Pulse  Avg: 90.8  Min: 81  Max: 101  RR 20 Resp  Avg: 19.5  Min: 18  Max: 20  SaO2 99 % Not Delivered SpO2  Avg: 99 %  Min: 99 %  Max: 99 %       24 Hour I/O Current Shift I/O  Time Ins Outs 08/12 0701 - 08/13 0700 In: 240 [P.O.:240] Out: -       General: NAD Abdomen: obese, soft, NTTP, FF below the umbilicus Perineum: deferred Skin:  Warm and dry.  Cardiovascular:Regular rate and rhythm. Respiratory:  Clear to auscultation bilateral. Normal respiratory effort Extremities: no c/c/e  Medications Current Facility-Administered Medications  Medication Dose Route Frequency Provider Last Rate Last Dose  . acetaminophen (TYLENOL) tablet 650 mg  650 mg Oral Q4H PRN SWill Bonnet MD      . benzocaine-Menthol (DERMOPLAST) 20-0.5 % topical spray 1 application  1 application Topical PRN SWill Bonnet MD      . witch hazel-glycerin (TUCKS) pad 1 application  1 application Topical PRN SWill Bonnet MD       And  . dibucaine (NUPERCAINAL) 1 % rectal ointment 1 application  1 application Rectal PRN SWill Bonnet MD      . diphenhydrAMINE (BENADRYL) capsule 25 mg  25 mg Oral Q6H PRN SWill Bonnet MD      . ferrous sulfate tablet 325 mg  325 mg Oral BID WC SWill Bonnet MD   325 mg at 12/23/14 1834  . HYDROcodone-acetaminophen (NORCO/VICODIN) 5-325 MG per tablet 1 tablet  1 tablet Oral Q4H PRN SWill Bonnet MD       And  . HYDROcodone-acetaminophen (NORCO/VICODIN) 5-325 MG per tablet 2 tablet  2 tablet Oral Q4H PRN  SWill Bonnet MD      . ibuprofen (ADVIL,MOTRIN) tablet 600 mg  600 mg Oral 4 times per day SWill Bonnet MD   600 mg at 12/24/14 [phone] . lanolin ointment   Topical PRN SWill Bonnet MD      . ondansetron ([facility] tablet 4 mg  4 mg Oral Q4H PRN SWill Bonnet MD      . prenatal multivitamin tablet 1 tablet  1 tablet Oral Q1200 SWill Bonnet MD   1 tablet at 12/23/14 1325  . senna-docusate (Senokot-S) tablet 2 tablet  2 tablet Oral Q24H SWill Bonnet MD   2 tablet at 12/23/14 2346  . simethicone (MYLICON) chewable tablet 80 mg  80 mg Oral PRN SWill Bonnet MD          Recent Labs Lab 12/22/14 0145 12/23/14 0615  WBC 15.9* 18.2*  HGB 13.1 12.0  HCT 38.5 35.6  PLT 185 163    Assessment & Plan:  Pt doing well  *Postpartum/postop: routine care -rpt MMR and varicella titers at PPV -admit UDS neg *Dispo: home today  O POS /  Rubella and varicella s/p injections x 2 / RPR negative / HIV negative / HepBsAg negative / Tdap UTD: Yes/pap needed PP / Breast  / BC: OCPs; to start at 4wk PPV / Follow up: Birder Robson MD [facility] Pager [phone]

## 2014-12-24 NOTE — Progress Notes (Signed)
Prenatal records indicate that pt received TDaP vaccine on 07/27/14 and Influenza vaccine on 05/25/14. Reynold Bowen, RN 12/24/2014 12:16 PM

## 2014-12-24 NOTE — Progress Notes (Signed)
Discharge instructions provided.  Pt and pt's mother verbalize understanding of all instructions and follow-up care.  Prescriptions given.  Pt discharged to home with infant at 1315 on 12/24/14 via wheelchair by RN. Reynold Bowen, RN 12/24/2014 3:12 PM

## 2015-01-17 LAB — HM PAP SMEAR: HM PAP: NEGATIVE

## 2015-09-21 IMAGING — US US OB < 14 WEEKS - US OB TV
1 series · 14 of 28 positions shown · non-contrast
Comparison: None.

CLINICAL DATA: Pregnancy.

EXAM:
OBSTETRIC <14 WK US AND TRANSVAGINAL OB US
TECHNIQUE: Both transabdominal and transvaginal ultrasound examinations were
performed for complete evaluation of the gestation as well as the
maternal uterus, adnexal regions, and pelvic cul-de-sac.
Transvaginal technique was performed to assess early pregnancy.

[Series 1: us ob < 14 weeks - us ob tv · 0.26mm/px · 14 of 105 slices shown]
[im 4/105]
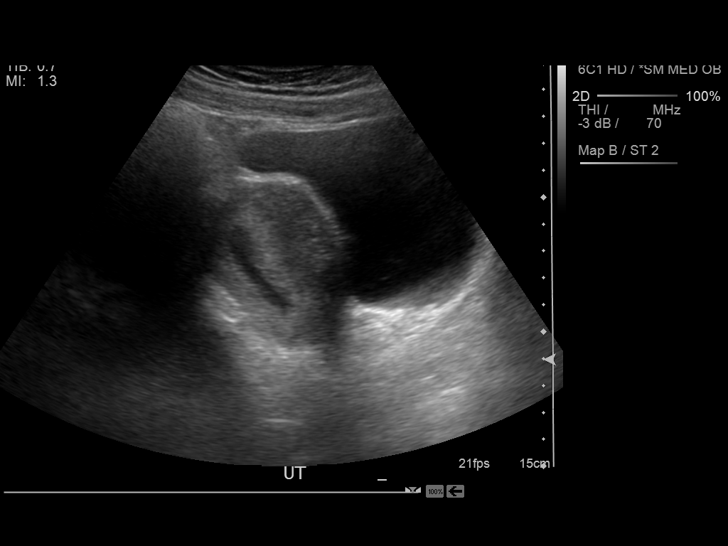
[im 12/105]
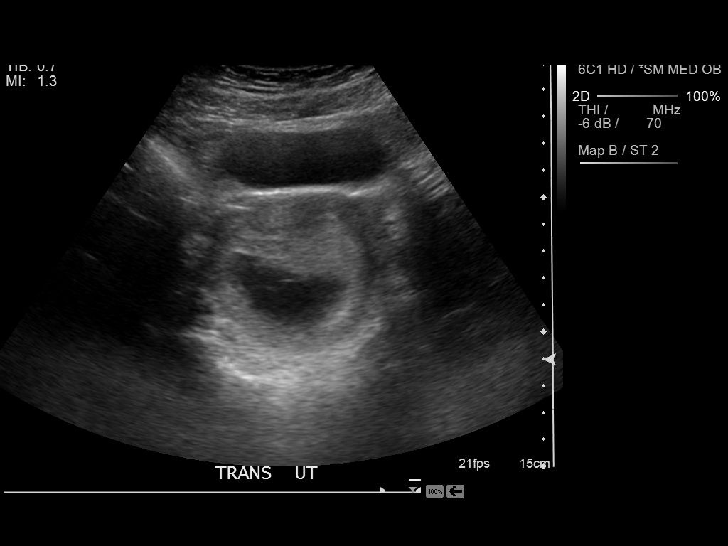
[im 20/105]
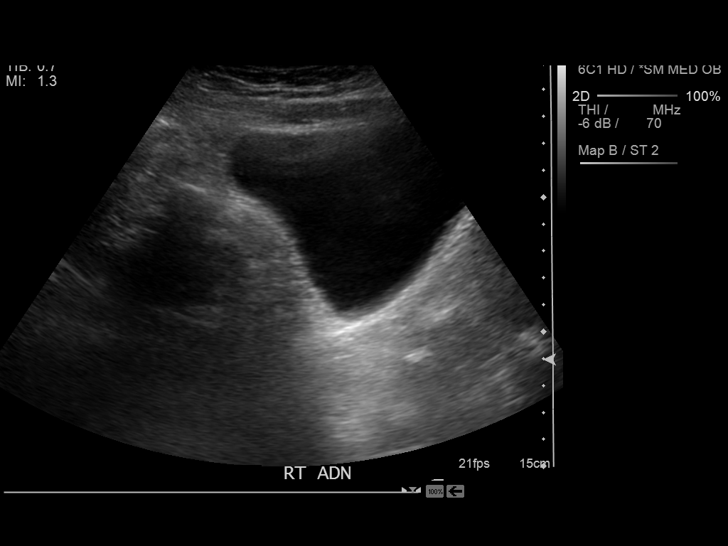
[im 27/105]
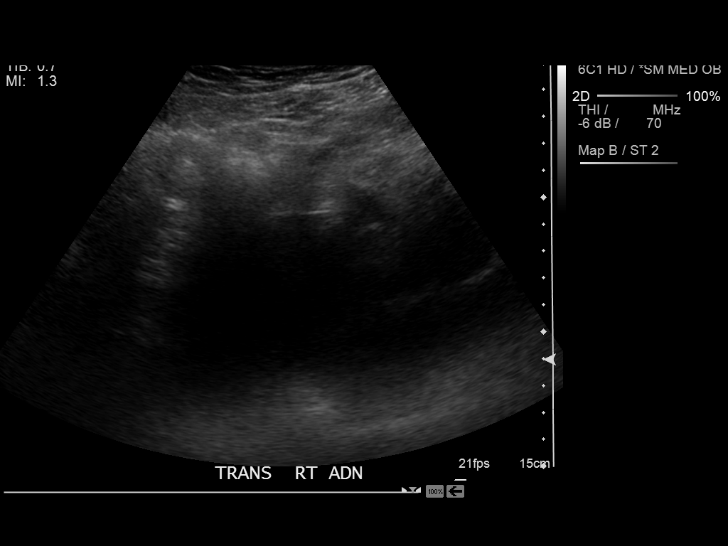
[im 35/105]
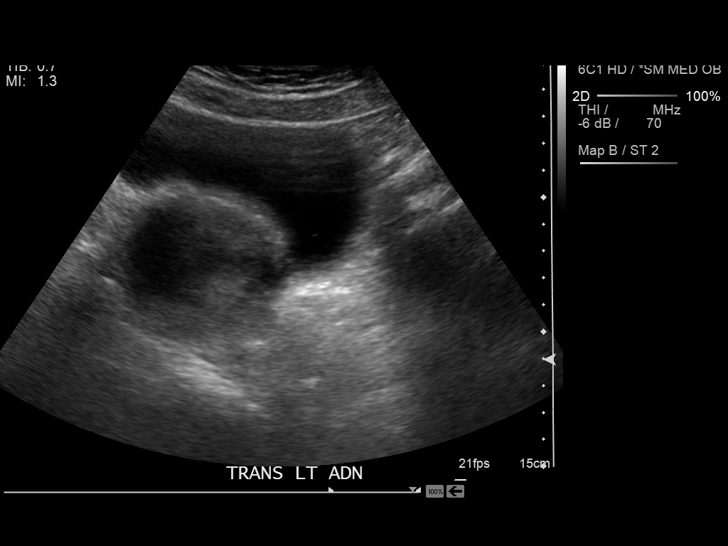
[im 43/105]
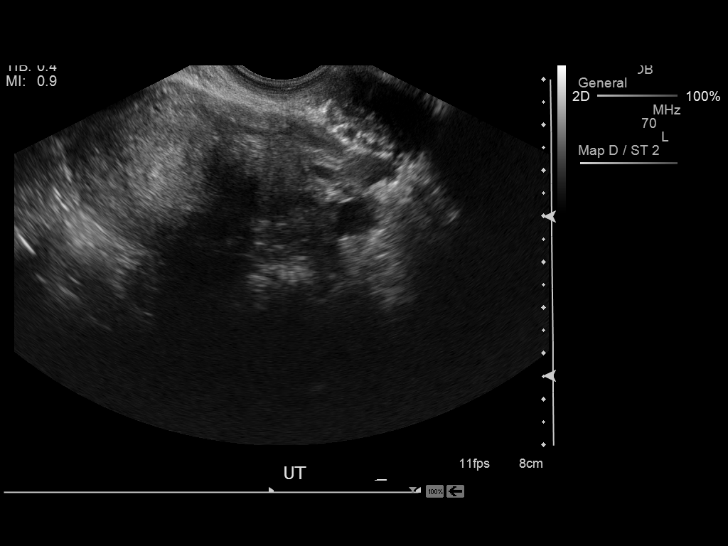
[im 51/105]
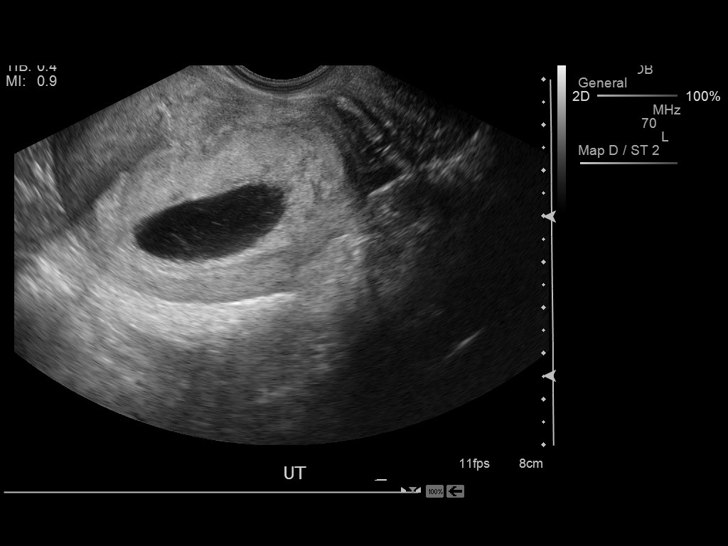
[im 58/105]
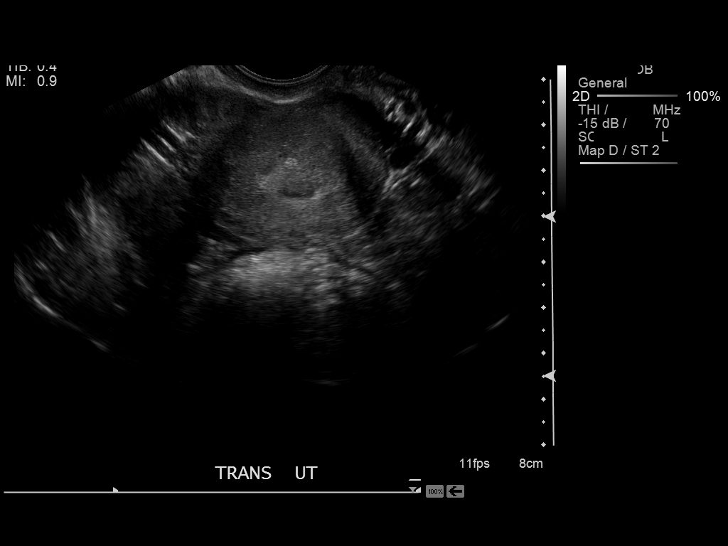
[im 66/105]
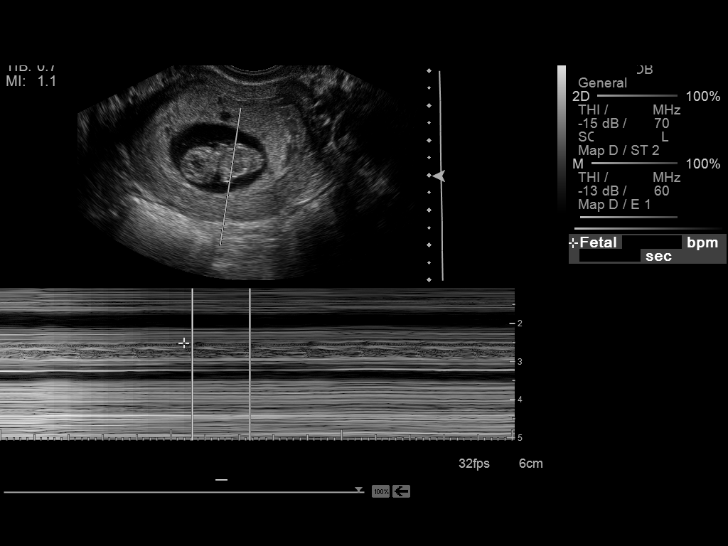
[im 74/105]
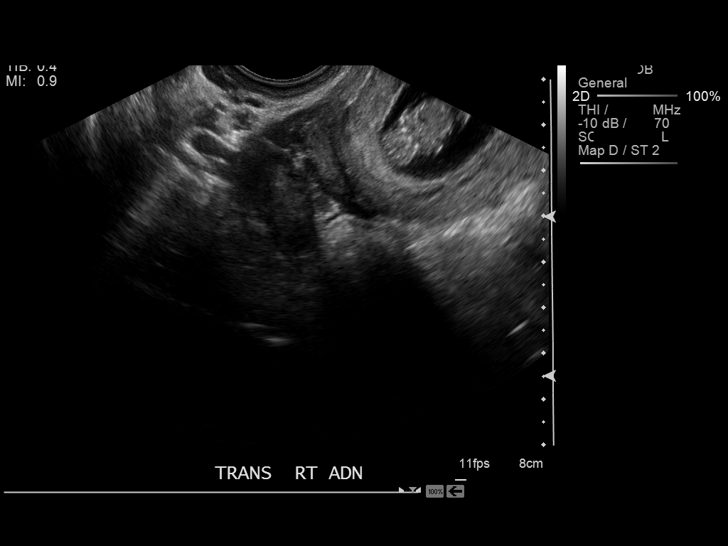
[im 81/105]
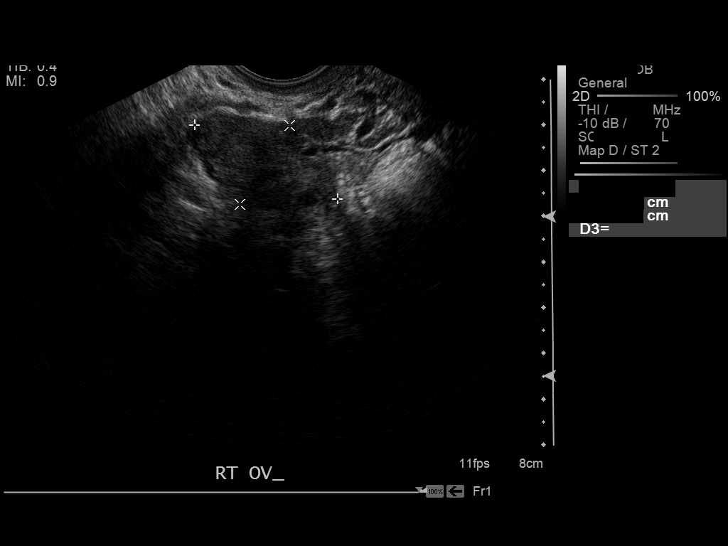
[im 89/105]
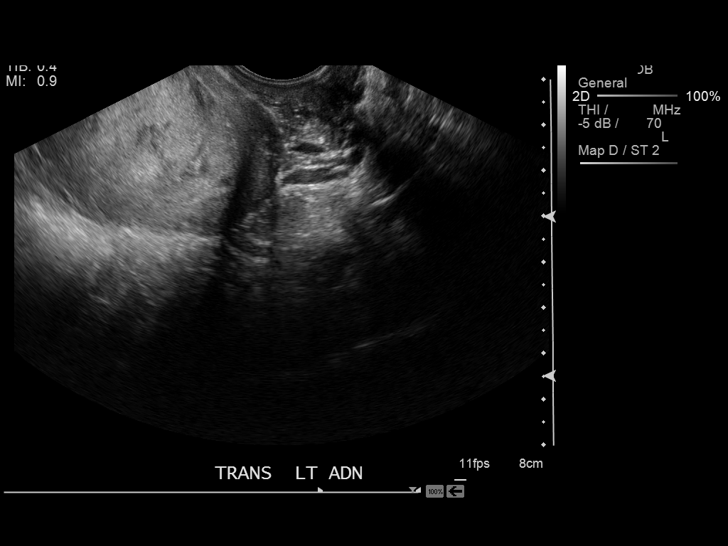
[im 97/105]
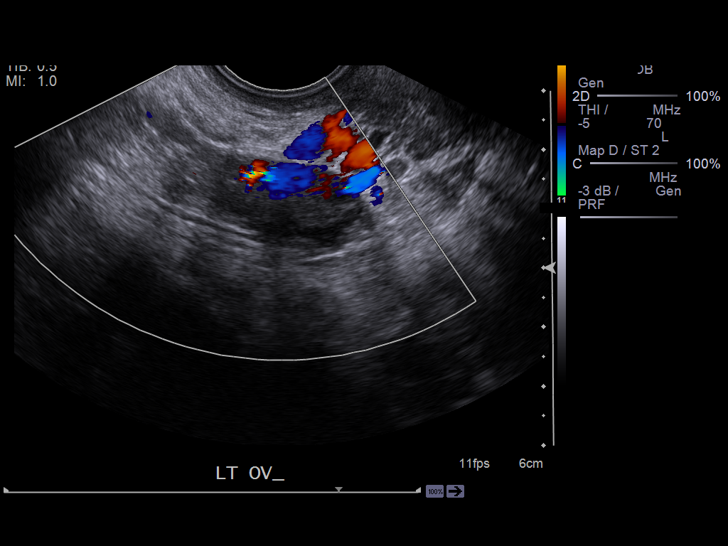
[im 105/105]
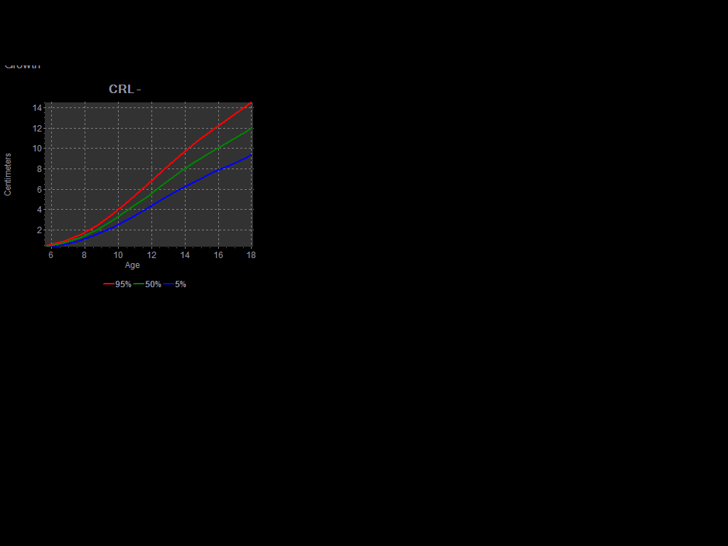

[14 of 28 positions shown; findings below may reference images not displayed]

FINDINGS: Intrauterine gestational sac: Visualized/normal in shape.

Yolk sac:  Present.

Embryo:  Present.

Cardiac Activity: Present.

Heart Rate:  178 bpm

CRL:   2.5 cm 9 w 2 d                  US EDC: 01/01/2015

Maternal uterus/adnexae: Subtle debris is noted within the
gestational sac. This is nonspecific finding and may represent mild
amount of proteinaceous material.
IMPRESSION: Single viable intrauterine pregnancy at 9 weeks 2 days.

## 2016-04-03 ENCOUNTER — Other Ambulatory Visit: Payer: Self-pay | Admitting: Obstetrics and Gynecology

## 2016-04-03 DIAGNOSIS — N644 Mastodynia: Secondary | ICD-10-CM

## 2016-04-22 ENCOUNTER — Ambulatory Visit
Admission: RE | Admit: 2016-04-22 | Discharge: 2016-04-22 | Disposition: A | Discharge status: Home / Self Care | Payer: 59 | Source: Ambulatory Visit | Attending: Obstetrics and Gynecology | Admitting: Obstetrics and Gynecology

## 2016-04-22 DIAGNOSIS — R922 Inconclusive mammogram: Secondary | ICD-10-CM | POA: Diagnosis not present

## 2016-04-22 DIAGNOSIS — N644 Mastodynia: Secondary | ICD-10-CM | POA: Insufficient documentation

## 2016-04-22 LAB — HM MAMMOGRAPHY

## 2016-12-09 ENCOUNTER — Ambulatory Visit: Payer: Self-pay | Admitting: Obstetrics and Gynecology

## 2017-03-01 ENCOUNTER — Emergency Department
Admission: EM | Admit: 2017-03-01 | Discharge: 2017-03-02 | Disposition: A | Discharge status: Home / Self Care | Payer: 59 | Attending: Emergency Medicine | Admitting: Emergency Medicine

## 2017-03-01 ENCOUNTER — Encounter: Payer: Self-pay | Admitting: Emergency Medicine

## 2017-03-01 DIAGNOSIS — Y9389 Activity, other specified: Secondary | ICD-10-CM | POA: Insufficient documentation

## 2017-03-01 DIAGNOSIS — S61412A Laceration without foreign body of left hand, initial encounter: Secondary | ICD-10-CM | POA: Insufficient documentation

## 2017-03-01 DIAGNOSIS — Y999 Unspecified external cause status: Secondary | ICD-10-CM | POA: Insufficient documentation

## 2017-03-01 DIAGNOSIS — Z87891 Personal history of nicotine dependence: Secondary | ICD-10-CM | POA: Diagnosis not present

## 2017-03-01 DIAGNOSIS — Y92009 Unspecified place in unspecified non-institutional (private) residence as the place of occurrence of the external cause: Secondary | ICD-10-CM | POA: Diagnosis not present

## 2017-03-01 DIAGNOSIS — S61012A Laceration without foreign body of left thumb without damage to nail, initial encounter: Secondary | ICD-10-CM

## 2017-03-01 DIAGNOSIS — S6992XA Unspecified injury of left wrist, hand and finger(s), initial encounter: Secondary | ICD-10-CM | POA: Diagnosis present

## 2017-03-01 DIAGNOSIS — W25XXXA Contact with sharp glass, initial encounter: Secondary | ICD-10-CM | POA: Insufficient documentation

## 2017-03-01 MED ORDER — LIDOCAINE HCL (PF) 1 % IJ SOLN
10.0000 mL | Freq: Once | INTRAMUSCULAR | Status: AC
  Administered 2017-03-01: 10 mL
  Filled 2017-03-01: qty 10

## 2017-03-01 NOTE — ED Notes (Signed)
Pt reports she was knocked through a glass door window by her mom's dog and cut her left hand on the glass. Pt has appros 3 cm cut to her left thumb with slight bleeding at this time. Pt has several other small abrasions to her hand and wrist/ CMS intact.

## 2017-03-01 NOTE — ED Provider Notes (Signed)
[facility] Emergency Department Provider Note ____________________________________________  Time seen: 2232  I have reviewed the triage vital signs and the nursing notes.  HISTORY  Chief Complaint  Laceration  HPI Kristen Hughes is a 21 y.o. female presents to the ED for evaluation of laceration to the left hand and thumb after an accident at home. Patient describes her dog jumped up on her, knocking her backwards. She fell, sort her left hand into a glass door, that broke. She sustained laceration to the hand and presents now for wound care management. She denies any other injury at this time. She reports a tetanus status current as of 2 years prior.  Past Medical History:  Diagnosis Date  . Anxiety   . Depression     Patient Active Problem List   Diagnosis Date Noted  . Labor and delivery, indication for care 12/22/2014  . Indication for care in labor or delivery 11/20/2014  . Pregnancy 09/29/2014  . Preterm contractions 09/29/2014    Past Surgical History:  Procedure Laterality Date  . NO PAST SURGERIES      Prior to Admission medications   Medication Sig Start Date End Date Taking? Authorizing Provider  docusate sodium (COLACE) 100 MG capsule Take 1 capsule (100 mg total) by mouth 2 (two) times daily as needed for mild constipation. 12/24/14   [place] Bing, MD  etonogestrel-ethinyl estradiol (NUVARING) 0.12-0.015 MG/24HR vaginal ring Place 1 each vaginally every 28 (twenty-eight) days. Insert vaginally and leave in place for 3 consecutive weeks, then remove for 1 week.    [provider]  HYDROcodone-acetaminophen (NORCO/VICODIN) 5-325 MG per tablet Take 1 tablet by mouth every 6 (six) hours as needed. 12/24/14   [place] Bing, MD  ibuprofen (ADVIL,MOTRIN) 600 MG tablet Take 1 tablet (600 mg total) by mouth every 6 (six) hours as needed for mild pain or cramping. 12/24/14   [place] Bing, MD  Prenatal Vit-Fe Fumarate-FA  (MULTIVITAMIN-PRENATAL) 27-0.8 MG TABS tablet Take 1 tablet by mouth daily at 12 noon.    [provider]    Allergies Amoxicillin and Penicillin g  Family History  Problem Relation Age of Onset  . Schizophrenia Brother   . Breast cancer Paternal Grandmother   . Cancer Maternal Grandfather 60       COLON  . Cancer Other   . Diabetes Other        TYPE 2  . Depression Other     Social History Social History  Substance Use Topics  . Smoking status: Former Smoker    Packs/day: 0.25    Years: 5.00    Types: Cigarettes    Quit date: 09/30/2014  . Smokeless tobacco: Never Used  . Alcohol use No    Review of Systems  Constitutional: Negative for fever. Cardiovascular: Negative for chest pain. Respiratory: Negative for shortness of breath. Musculoskeletal: Negative for back pain. Skin: Negative for rash. Left hand laceration as above. Neurological: Negative for headaches, focal weakness or numbness. ____________________________________________  PHYSICAL EXAM:  VITAL SIGNS: ED Triage Vitals  Enc Vitals Group     BP --      Pulse Rate 03/01/17 2125 100     Resp 03/01/17 2125 20     Temp 03/01/17 2125 99 F (37.2 C)     Temp Source 03/01/17 2125 Oral     SpO2 03/01/17 2125 98 %     Weight 03/01/17 2121 198 lb (89.8 kg)     Height 03/01/17 2121 5\' 4"  (1.626 m)  Head Circumference --      Peak Flow --      Pain Score 03/01/17 2121 7     Pain Loc --      Pain Edu? --      Excl. in GC? --     Constitutional: Alert and oriented. Well appearing and in no distress. Head: Normocephalic and atraumatic. Cardiovascular: Normal distal pulses and capillary refill. Respiratory: Normal respiratory effort.  Musculoskeletal: Normal composite fist. Left hand with a laceration to the lateral aspect of the thumb and the ulnar aspect of the palm. The patient has scattered superficial abrasions noted over the hand and forearm. Nontender with normal range of motion in all  extremities.  Neurologic:  Normal gross sensation. Normal speech and language. No gross focal neurologic deficits are appreciated. Skin:  Skin is warm, dry and intact. No rash noted. ____________________________________________  PROCEDURES  LACERATION REPAIR Performed by: Lissa HoardMenshew, Tanja Gift V Bacon Authorized by: Lissa HoardMenshew, Shaelee Forni V Bacon Consent: Verbal consent obtained. Risks and benefits: risks, benefits and alternatives were discussed Consent given by: patient Patient identity confirmed: provided demographic data Prepped and Draped in normal sterile fashion Wound explored  Laceration Location: left hand & thumb  Laceration Length: 2 cm & 2 cm  No Foreign Bodies seen or palpated  Anesthesia: local infiltration  Local anesthetic: lidocaine 1% w/o epinephrine  Anesthetic total: 5 ml  Irrigation method: syringe Amount of cleaning: standard  Skin closure: 4-0 nylon  Number of sutures: 4 & 5 respectively  Technique: interrupted  Patient tolerance: Patient tolerated the procedure well with no immediate complications. ____________________________________________  INITIAL IMPRESSION / ASSESSMENT AND PLAN / ED COURSE  Patient ED evaluation management of left hand and thumb lacerations. Patient's wounds are appropriately prepped and repaired using sutures. Wound care structures are provided. [facility] care provider for suture removal in 10-12 days. ____________________________________________  FINAL CLINICAL IMPRESSION(S) / ED DIAGNOSES  Final diagnoses:  Laceration of left hand without foreign body, initial encounter  Laceration of left thumb without foreign body without damage to nail, initial encounter      Lissa HoardMenshew, Heddy Vidana V Bacon, PA-C 03/01/17 2348    Don PerkingVeronese, [place], MD 03/05/17 [phone]

## 2017-03-01 NOTE — Discharge Instructions (Signed)
Keep the wounds clean, dry, and covered. See your provider or [facility] for suture removal in 10-12 days.

## 2017-03-01 NOTE — ED Triage Notes (Signed)
Reports dog jumped on her and knocked her down.  Patient had a glass in her hand it it broke.  Patient with lacerations on her left hand.

## 2017-09-07 IMAGING — US US BREAST*L* LIMITED INC AXILLA
1 series · 2 of 2 positions shown · non-contrast
Comparison: None

ADDENDUM:
The RECOMMENDATION should read:

Bilateral screening mammograms at age 38.
CLINICAL DATA: 20-year-old female with diffuse left breast and left
axillary pain.
EXAM:
ULTRASOUND OF THE LEFT BREAST

[Series 1: us breast*left* limited inc axilla · 0.06mm/px · 2 of 2 slices shown]
[im 1/2]
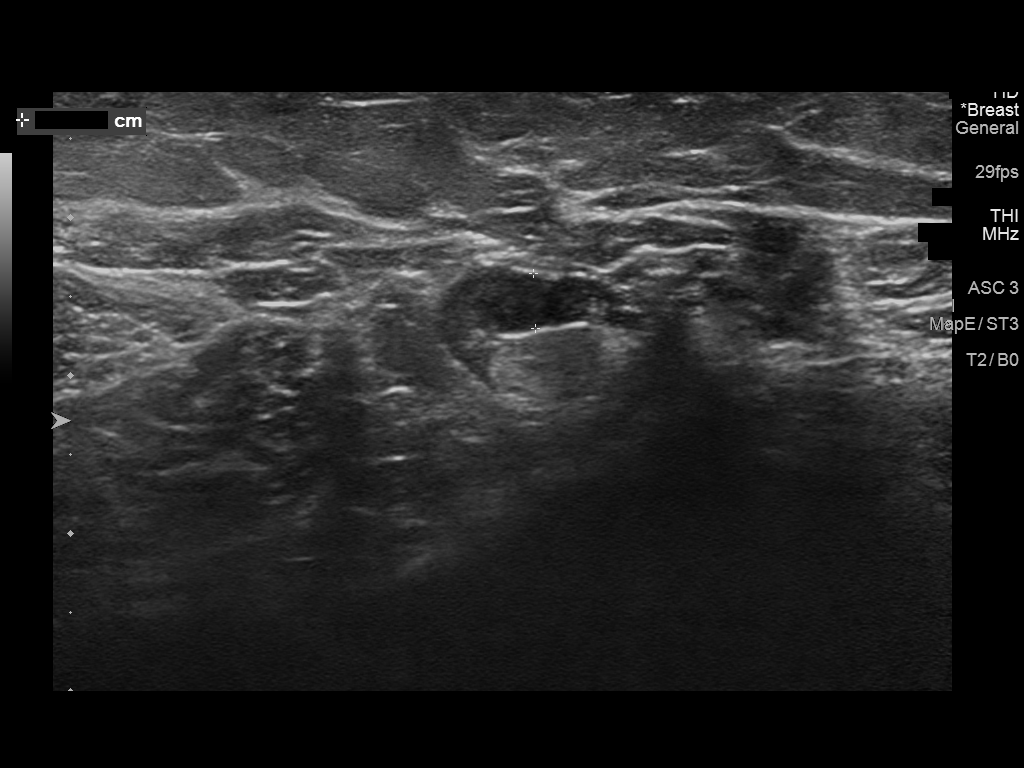
[im 2/2]
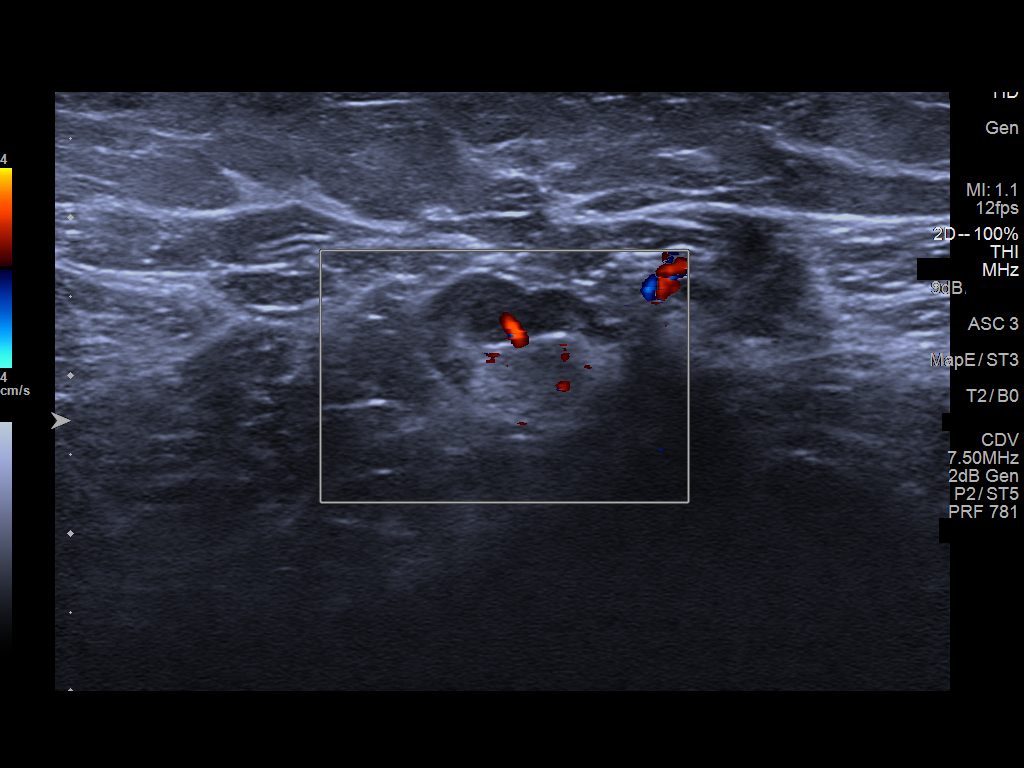

[2 of 2 positions shown; findings below may reference images not displayed]

FINDINGS: Targeted ultrasound is performed, showing no solid or cystic mass,
distortion, abnormal shadowing or enlarged lymph nodes within the
left breast or left axilla.
IMPRESSION: No sonographic abnormality within the left breast or left axilla.

RECOMMENDATION:
Bilateral screening mammograms in 1 year.

I have discussed the findings, causes of and remedies for breast
pain and recommendations with the patient. Results were also
provided in writing at the conclusion of the visit. If applicable, a
reminder letter will be sent to the patient regarding the next
appointment.

BI-RADS CATEGORY  1: Negative.

## 2018-05-14 ENCOUNTER — Emergency Department
Admission: EM | Admit: 2018-05-14 | Discharge: 2018-05-15 | Disposition: A | Discharge status: Home / Self Care | Payer: Managed Care, Other (non HMO) | Attending: Emergency Medicine | Admitting: Emergency Medicine

## 2018-05-14 ENCOUNTER — Other Ambulatory Visit: Payer: Self-pay

## 2018-05-14 ENCOUNTER — Encounter: Payer: Self-pay | Admitting: Emergency Medicine

## 2018-05-14 DIAGNOSIS — Z87891 Personal history of nicotine dependence: Secondary | ICD-10-CM | POA: Diagnosis not present

## 2018-05-14 DIAGNOSIS — L03116 Cellulitis of left lower limb: Secondary | ICD-10-CM | POA: Diagnosis not present

## 2018-05-14 DIAGNOSIS — Z79899 Other long term (current) drug therapy: Secondary | ICD-10-CM | POA: Insufficient documentation

## 2018-05-14 DIAGNOSIS — R21 Rash and other nonspecific skin eruption: Secondary | ICD-10-CM | POA: Diagnosis present

## 2018-05-14 NOTE — ED Provider Notes (Signed)
[facility] Emergency Department Provider Note ____________________________________________  Time seen: 2226  I have reviewed the triage vital signs and the nursing notes.  HISTORY  Chief Complaint  Rash  HPI Kristen Hughes is a 23 y.o. female who presents to the ED for evaluation of intermittent episodes of pustules and focal papules to the lower extremity.  She describes areas will be tender and red to touch.  If she manipulates them they will express purulent and bloody material at times.  She describes location to the inner thigh as well as to the lower back region.  She denies any fevers, chills, or sweats.  She denies any previous history of MRSA infection, or previous I&D procedures.  Past Medical History:  Diagnosis Date  . Anxiety   . Depression     Patient Active Problem List   Diagnosis Date Noted  . Labor and delivery, indication for care 12/22/2014  . Indication for care in labor or delivery 11/20/2014  . Pregnancy 09/29/2014  . Preterm contractions 09/29/2014    Past Surgical History:  Procedure Laterality Date  . NO PAST SURGERIES      Prior to Admission medications   Medication Sig Start Date End Date Taking? Authorizing Provider  docusate sodium (COLACE) 100 MG capsule Take 1 capsule (100 mg total) by mouth 2 (two) times daily as needed for mild constipation. 12/24/14   [place] BingPickens, Charlie, MD  etonogestrel-ethinyl estradiol (NUVARING) 0.12-0.015 MG/24HR vaginal ring Place 1 each vaginally every 28 (twenty-eight) days. Insert vaginally and leave in place for 3 consecutive weeks, then remove for 1 week.    [provider]  HYDROcodone-acetaminophen (NORCO/VICODIN) 5-325 MG per tablet Take 1 tablet by mouth every 6 (six) hours as needed. 12/24/14   [place] BingPickens, Charlie, MD  ibuprofen (ADVIL,MOTRIN) 600 MG tablet Take 1 tablet (600 mg total) by mouth every 6 (six) hours as needed for mild pain or cramping. 12/24/14   [place] BingPickens, Charlie, MD   Prenatal Vit-Fe Fumarate-FA (MULTIVITAMIN-PRENATAL) 27-0.8 MG TABS tablet Take 1 tablet by mouth daily at 12 noon.    [provider]    Allergies Amoxicillin and Penicillin g  Family History  Problem Relation Age of Onset  . Schizophrenia Brother   . Breast cancer Paternal Grandmother   . Cancer Maternal Grandfather 60       COLON  . Cancer Other   . Diabetes Other        TYPE 2  . Depression Other     Social History Social History   Tobacco Use  . Smoking status: Former Smoker    Packs/day: 0.25    Years: 5.00    Pack years: 1.25    Types: Cigarettes    Last attempt to quit: 09/30/2014    Years since quitting: 3.6  . Smokeless tobacco: Never Used  Substance Use Topics  . Alcohol use: No    Alcohol/week: 0.0 standard drinks  . Drug use: Yes    Types: Marijuana    Comment: positive test this pregnancy    Review of Systems  Constitutional: Negative for fever. Cardiovascular: Negative for chest pain. Respiratory: Negative for shortness of breath. Musculoskeletal: Negative for back pain. Skin: Negative for rash. Skin lesions as above Neurological: Negative for headaches, focal weakness or numbness. ____________________________________________  PHYSICAL EXAM:  VITAL SIGNS: ED Triage Vitals  Enc Vitals Group     BP 05/14/18 2130 135/60     Pulse Rate 05/14/18 2130 87     Resp 05/14/18 2130 18  Temp 05/14/18 2130 98.6 F (37 C)     Temp Source 05/14/18 2130 Oral     SpO2 05/14/18 2130 97 %     Weight 05/14/18 2118 197 lb (89.4 kg)     Height 05/14/18 2118 5\' 5"  (1.651 m)     Head Circumference --      Peak Flow --      Pain Score 05/14/18 2118 4     Pain Loc --      Pain Edu? --      Excl. in GC? --     Constitutional: Alert and oriented. Well appearing and in no distress. Head: Normocephalic and atraumatic. Eyes: Conjunctivae are normal. Normal extraocular movements Cardiovascular: Normal rate, regular rhythm. Normal distal  pulses. Respiratory: Normal respiratory effort. No wheezes/rales/rhonchi. Musculoskeletal: Nontender with normal range of motion in all extremities.  Neurologic:  Normal gait without ataxia. Normal speech and language. No gross focal neurologic deficits are appreciated. Skin:  Skin is warm, dry and intact. No rash noted.  Patient with 1 resolving pustule to the lateral torso.  She also has another area to the medial inner thigh. ____________________________________________  PROCEDURES  Procedures ____________________________________________  INITIAL IMPRESSION / ASSESSMENT AND PLAN / ED COURSE  Patient with ED evaluation of previous episodes of folliculitis and pustules to the legs and torso.  Symptoms are consistent with a probable local cellulitis/folliculitis caused by MRSA.  Treat her no active lesions at this time.  Patient is discharged with a small sample of chlorhexidine soap to use as needed.  She is also encouraged to keep any areas clean, dry, covered patient should apply warm compresses to promote healing.  And avoid manipulation.  She will follow with primary provider should any lesion require intervention including I&D procedure. ____________________________________________  FINAL CLINICAL IMPRESSION(S) / ED DIAGNOSES  Final diagnoses:  Cellulitis of left lower extremity      Yaresly Menzel, Charlesetta Ivory, PA-C 05/14/18 2247    Phineas Semen, MD 05/14/18 [phone]

## 2018-05-14 NOTE — ED Triage Notes (Signed)
Patient ambulatory to triage with steady gait, without difficulty or distress noted; pt reports "big pimples between legs that turn purple" x month

## 2018-05-14 NOTE — Discharge Instructions (Addendum)
Your exam shows signs of previous skin infections. Use the surgical scrub as directed. Avoid picking or squeezing any bumps or pimples. Apply warm compresses and keep them covered as needed.

## 2018-10-02 ENCOUNTER — Other Ambulatory Visit: Payer: Self-pay

## 2018-10-02 ENCOUNTER — Encounter: Payer: Self-pay | Admitting: Obstetrics and Gynecology

## 2018-10-02 ENCOUNTER — Other Ambulatory Visit (HOSPITAL_COMMUNITY)
Admission: RE | Admit: 2018-10-02 | Discharge: 2018-10-02 | Disposition: A | Discharge status: Home / Self Care | Payer: Managed Care, Other (non HMO) | Source: Ambulatory Visit | Attending: Obstetrics and Gynecology | Admitting: Obstetrics and Gynecology

## 2018-10-02 ENCOUNTER — Ambulatory Visit (INDEPENDENT_AMBULATORY_CARE_PROVIDER_SITE_OTHER): Payer: Managed Care, Other (non HMO) | Admitting: Obstetrics and Gynecology

## 2018-10-02 VITALS — BP 122/78 | HR 96 | Ht 64.0 in | Wt 202.0 lb

## 2018-10-02 DIAGNOSIS — Z124 Encounter for screening for malignant neoplasm of cervix: Secondary | ICD-10-CM | POA: Diagnosis not present

## 2018-10-02 DIAGNOSIS — R0789 Other chest pain: Secondary | ICD-10-CM

## 2018-10-02 DIAGNOSIS — Z113 Encounter for screening for infections with a predominantly sexual mode of transmission: Secondary | ICD-10-CM | POA: Insufficient documentation

## 2018-10-02 MED ORDER — IBUPROFEN 800 MG PO TABS: 800.0000 mg | ORAL_TABLET | Freq: Three times a day (TID) | ORAL | 1 refills | Status: DC | PRN

## 2018-10-02 NOTE — Progress Notes (Signed)
Gynecology STD Evaluation   hChief Complaint:  Chief Complaint  Patient presents with  . STD check  . Left breast lump    History of Present Illness: Patient is a 23 y.o. G1P1001 presents for STD testing.  The patient has not noted intermenstrual spotting,  has not experienced postcoital bleeding, and does not report increased vaginal discharge.  There is not a history of prior sexually transmitted infection(s).   She currently uses no hormonal contraception.   Left breast pain/chest wall pain.  Has had prior left breast pain evaluated in 2016 with normal mammogram.  Located underneath the left breast, midclavicular line, no palpable masses but feel like she feels a lump, no trauma or inciting event.  Exacerbated by underwire in bras, laying on that side at night.  Pain is also worsened with inspiration, coughing, laughing or other actions which cause chest wall motion.  Review of Systems: 10 point review of systems negative unless otherwise noted in HPI  Past Medical History:  Past Medical History:  Diagnosis Date  . Anxiety   . Depression     Past Surgical History:  Past Surgical History:  Procedure Laterality Date  . NO PAST SURGERIES      Gynecologic History: Patient's last menstrual period was 09/15/2018 (exact date).  Obstetric History: G1P1001  Family History:  Family History  Problem Relation Age of Onset  . Schizophrenia Brother   . Breast cancer Paternal Grandmother   . Cancer Maternal Grandfather 60       COLON  . Cancer Other   . Diabetes Other        TYPE 2  . Depression Other     Social History:  Social History   Socioeconomic History  . Marital status: Single    Spouse name: Not on file  . Number of children: 1  . Years of education: 7312  . Highest education level: Not on file  Occupational History  . Occupation: UNEMPLOYED  Social Needs  . Financial resource strain: Not on file  . Food insecurity:    Worry: Not on file    Inability: Not  on file  . Transportation needs:    Medical: Not on file    Non-medical: Not on file  Tobacco Use  . Smoking status: Former Smoker    Packs/day: 0.25    Years: 5.00    Pack years: 1.25    Types: Cigarettes    Last attempt to quit: 09/30/2014    Years since quitting: 4.0  . Smokeless tobacco: Never Used  Substance and Sexual Activity  . Alcohol use: No    Alcohol/week: 0.0 standard drinks  . Drug use: Yes    Types: Marijuana    Comment: positive test this pregnancy  . Sexual activity: Yes    Birth control/protection: Inserts  Lifestyle  . Physical activity:    Days per week: Not on file    Minutes per session: Not on file  . Stress: Not on file  Relationships  . Social connections:    Talks on phone: Not on file    Gets together: Not on file    Attends religious service: Not on file    Active member of club or organization: Not on file    Attends meetings of clubs or organizations: Not on file    Relationship status: Not on file  . Intimate partner violence:    Fear of current or ex partner: Not on file    Emotionally abused: Not on  file    Physically abused: Not on file    Forced sexual activity: Not on file  Other Topics Concern  . Not on file  Social History Narrative  . Not on file    Allergies:  Allergies  Allergen Reactions  . Amoxicillin Hives  . Penicillin G Hives    Medications: Prior to Admission medications   Medication Sig Start Date End Date Taking? Authorizing Provider  docusate sodium (COLACE) 100 MG capsule Take 1 capsule (100 mg total) by mouth 2 (two) times daily as needed for mild constipation. 12/24/14   Nubieber Bing, MD  etonogestrel-ethinyl estradiol (NUVARING) 0.12-0.015 MG/24HR vaginal ring Place 1 each vaginally every 28 (twenty-eight) days. Insert vaginally and leave in place for 3 consecutive weeks, then remove for 1 week.    [provider]  HYDROcodone-acetaminophen (NORCO/VICODIN) 5-325 MG per tablet Take 1 tablet by  mouth every 6 (six) hours as needed. 12/24/14   Avera Bing, MD  ibuprofen (ADVIL,MOTRIN) 600 MG tablet Take 1 tablet (600 mg total) by mouth every 6 (six) hours as needed for mild pain or cramping. 12/24/14   [place] Bing, MD  Prenatal Vit-Fe Fumarate-FA (MULTIVITAMIN-PRENATAL) 27-0.8 MG TABS tablet Take 1 tablet by mouth daily at 12 noon.    [provider]    Physical Exam unknown if currently breastfeeding. Patient's last menstrual period was 09/15/2018 (exact date).  General: NAD, appears stated age, well nourished HEENT: normocephalic, anicteric Pulmonary: No increased work of breathing, CTAB Breast: Breast symmetrical, no tenderness, no palpable nodules or masses, no skin or nipple retraction present, no nipple discharge.  No axillary or supraclavicular lymphadenopathy.  The area of concern is located below the left breast, in the midclavicular line corresponding to the floating ribs.  There are no palpable masses in the area although patient reports tenderness Genitourinary:  External: Normal external female genitalia.  Normal urethral meatus, normal  Bartholin's and Skene's glands.    Vagina: Normal vaginal mucosa, no evidence of prolapse.    Cervix: Grossly normal in appearance, no bleeding  Uterus: Non-enlarged, mobile, normal contour.  No CMT  Adnexa: ovaries non-enlarged, no adnexal masses  Rectal: deferred  Lymphatic: no evidence of inguinal lymphadenopathy Extremities: no edema, erythema, or tenderness Neurologic: Grossly intact Psychiatric: mood appropriate, affect full  Female chaperone present for pelvic and breast portions of the physical exam  Assessment: 23 y.o. G1P1001 presenting for STD screening Plan: Problem List Items Addressed This Visit    None    Visit Diagnoses    Routine screening for STI (sexually transmitted infection)    -  Primary   Relevant Orders   HEP, RPR, HIV Panel   Cytology - PAP   Cervical cancer screening       Relevant  Orders   Cytology - PAP   Left-sided chest wall pain          1) Full STI screening was offered accepted, pap updated  2) Left chest wall pain - reproducible, suspect MSK etiology.  Rx ibuprofen  3) A total of 20 minutes were spent in face-to-face contact with the patient during this encounter with over half of that time devoted to counseling and coordination of care.  4) Return in about 6 weeks (around 11/13/2018) for medication follow up.   Vena Austria, MD, [facility], [facility] 10/02/2018, 10:37 AM

## 2018-10-03 LAB — HEP, RPR, HIV PANEL
HIV Screen 4th Generation wRfx: NONREACTIVE
Hepatitis B Surface Ag: NEGATIVE
RPR Ser Ql: NONREACTIVE

## 2018-10-07 LAB — CYTOLOGY - PAP
Chlamydia: NEGATIVE
Diagnosis: UNDETERMINED — AB
HPV: DETECTED — AB
Neisseria Gonorrhea: NEGATIVE
Trichomonas: NEGATIVE

## 2018-10-12 ENCOUNTER — Telehealth: Payer: Self-pay | Admitting: Obstetrics and Gynecology

## 2018-10-12 NOTE — Telephone Encounter (Signed)
-----   Message from Vena Austria, MD sent at 10/12/2018 12:46 PM EDT ----- Regarding: Colposcopy Needs colposcopy in the next 1-4 weeks   Vena Austria, MD, [facility], [facility] 10/12/2018, 12:47 PM

## 2018-10-12 NOTE — Telephone Encounter (Signed)
Patient is schedule 10/28/18 °

## 2018-10-28 ENCOUNTER — Other Ambulatory Visit: Payer: Self-pay

## 2018-10-28 ENCOUNTER — Other Ambulatory Visit (HOSPITAL_COMMUNITY)
Admission: RE | Admit: 2018-10-28 | Discharge: 2018-10-28 | Disposition: A | Discharge status: Home / Self Care | Payer: Managed Care, Other (non HMO) | Source: Ambulatory Visit | Attending: Obstetrics and Gynecology | Admitting: Obstetrics and Gynecology

## 2018-10-28 ENCOUNTER — Encounter: Payer: Self-pay | Admitting: Obstetrics and Gynecology

## 2018-10-28 ENCOUNTER — Ambulatory Visit (INDEPENDENT_AMBULATORY_CARE_PROVIDER_SITE_OTHER): Payer: Managed Care, Other (non HMO) | Admitting: Obstetrics and Gynecology

## 2018-10-28 VITALS — BP 116/70 | HR 62 | Ht 64.0 in | Wt 200.0 lb

## 2018-10-28 DIAGNOSIS — R87810 Cervical high risk human papillomavirus (HPV) DNA test positive: Secondary | ICD-10-CM | POA: Insufficient documentation

## 2018-10-28 DIAGNOSIS — R87610 Atypical squamous cells of undetermined significance on cytologic smear of cervix (ASC-US): Secondary | ICD-10-CM

## 2018-10-28 HISTORY — PX: COLPOSCOPY: SHX161

## 2018-10-28 NOTE — Progress Notes (Signed)
   GYNECOLOGY CLINIC COLPOSCOPY PROCEDURE NOTE  23 y.o. G1P1001 here for colposcopy for ASCUS with POSITIVE high risk HPV  pap smear on 10/02/2018. Discussed underlying role for HPV infection in the development of cervical dysplasia, its natural history and progression/regression, need for surveillance.  Is the patient  pregnant: No LMP: Patient's last menstrual period was 10/20/2018 (exact date). Smoking status:  reports that she quit smoking about 4 years ago. Her smoking use included cigarettes. She has a 1.25 pack-year smoking history. She has never used smokeless tobacco. Future fertility desired:  Yes  Patient given informed consent, signed copy in the chart, time out was performed.  The patient was position in dorsal lithotomy position. Speculum was placed the cervix was visualized.   After application of acetic acid colposcopic inspection of the cervix was undertaken.   Colposcopy adequate, full visualization of transformation zone: Yes no visible lesions; corresponding biopsies obtained.   ECC specimen obtained:  Yes  All specimens were labeled and sent to pathology.   Patient was given post procedure instructions.  Will follow up pathology and manage accordingly.  Routine preventative health maintenance measures emphasized.  OBGyn Exam  Malachy Mood, MD, [facility], [facility]

## 2018-10-28 NOTE — Patient Instructions (Signed)
80% of the population will have exposure to human papilloma virus (HPV) during their lifetime.  HPV is a large group of viruses, and are also the causative virus for common warts and genital warts.  The pap smear tests for 13 high risk HPV strains that have some association with cervical cancer, but do not cause visual lesion such as warts.  HPV type 16 and 18 have the highest association with cervical cancer.  The vast majority of HPV infections will be uncomplicated at clear spontaneously in 12-18 months in non-immunocompromised patients.  Patient with compromised immune systems, those taking immunosuppressive drugs, or smoker have shown to have a lower clearance rate and higher persistence of HPV infection.    Currently there are no FDA approved treatments to promote HPV clearance.  Gardasil vaccination is available to prevent HPV infection, but this is only beneficial pre-exposure.  Abstaining from intercourse will not increase clearance  Properly followed HPV should not lead to cervical cancer.   The goal of screening is to identify patient who develop precancerous lesions of the cervix and treat these prior to progression to frank cervical cancer.  The incidence of cervical cancer is 7 cases per 100,000 women in the [place] a year.  This relatively low rate is in part due to universal screening as well as vaccination efforts.

## 2018-11-11 ENCOUNTER — Ambulatory Visit (INDEPENDENT_AMBULATORY_CARE_PROVIDER_SITE_OTHER): Payer: Managed Care, Other (non HMO) | Admitting: Obstetrics and Gynecology

## 2018-11-11 ENCOUNTER — Other Ambulatory Visit: Payer: Self-pay

## 2018-11-11 ENCOUNTER — Encounter: Payer: Self-pay | Admitting: Obstetrics and Gynecology

## 2018-11-11 VITALS — BP 120/80 | Ht 64.0 in | Wt 204.0 lb

## 2018-11-11 DIAGNOSIS — N644 Mastodynia: Secondary | ICD-10-CM

## 2018-11-11 NOTE — Progress Notes (Signed)
Obstetrics & Gynecology Office Visit   Chief Complaint:  Chief Complaint  Patient presents with  . Follow-up    History of Present Illness: 23 year old G1P1001 presenting for follow up left substernal tenderness.  The area of concern was noted to be underneath the left breast.  There was no clearly discernable mass.  Suspicion was for MSK etiology.  She had been using ibuprofen as well as wearing looser fitting bras without underwires.  She has noted some improvement but states the area in question is still notable.  It has not changed in size.  She has not notable family history of breast cancer.    Review of Systems: Review of Systems  Constitutional: Negative.   Cardiovascular: Positive for chest pain.  Skin: Negative.    Past Medical History:  Past Medical History:  Diagnosis Date  . Anxiety   . Depression     Past Surgical History:  Past Surgical History:  Procedure Laterality Date  . COLPOSCOPY  10/28/2018  . NO PAST SURGERIES      Gynecologic History: Patient's last menstrual period was 11/10/2018.  Obstetric History: G1P1001  Family History:  Family History  Problem Relation Age of Onset  . Schizophrenia Brother   . Breast cancer Paternal Grandmother   . Cancer Maternal Grandfather 60       COLON  . Cancer Other   . Diabetes Other        TYPE 2  . Depression Other     Social History:  Social History   Socioeconomic History  . Marital status: Single    Spouse name: Not on file  . Number of children: 1  . Years of education: 212  . Highest education level: Not on file  Occupational History  . Occupation: UNEMPLOYED  Social Needs  . Financial resource strain: Not on file  . Food insecurity    Worry: Not on file    Inability: Not on file  . Transportation needs    Medical: Not on file    Non-medical: Not on file  Tobacco Use  . Smoking status: Former Smoker    Packs/day: 0.25    Years: 5.00    Pack years: 1.25    Types: Cigarettes   Quit date: 09/30/2014    Years since quitting: 4.1  . Smokeless tobacco: Never Used  Substance and Sexual Activity  . Alcohol use: No    Alcohol/week: 0.0 standard drinks  . Drug use: Yes    Types: Marijuana    Comment: positive test this pregnancy  . Sexual activity: Yes    Birth control/protection: Inserts  Lifestyle  . Physical activity    Days per week: Not on file    Minutes per session: Not on file  . Stress: Not on file  Relationships  . Social Musicianconnections    Talks on phone: Not on file    Gets together: Not on file    Attends religious service: Not on file    Active member of club or organization: Not on file    Attends meetings of clubs or organizations: Not on file    Relationship status: Not on file  . Intimate partner violence    Fear of current or ex partner: Not on file    Emotionally abused: Not on file    Physically abused: Not on file    Forced sexual activity: Not on file  Other Topics Concern  . Not on file  Social History Narrative  .  Not on file    Allergies:  Allergies  Allergen Reactions  . Amoxicillin Hives  . Penicillin G Hives    Medications: Prior to Admission medications   Medication Sig Start Date End Date Taking? Authorizing Provider  ibuprofen (ADVIL) 800 MG tablet Take 1 tablet (800 mg total) by mouth every 8 (eight) hours as needed. 10/02/18   Malachy Mood, MD    Physical Exam Vitals:  Vitals:   11/11/18 0918  BP: 120/80   Patient's last menstrual period was 11/10/2018.  General: NAD, well nourished, appears stated age 60: normocephalic, anicteric Pulmonary: No increased work of breathing Breast symmetrical, no tenderness, no palpable nodules or masses, no skin or nipple retraction present, no nipple discharge.  No axillary or supraclavicular lymphadenopathy. Neurologic: Grossly intact Psychiatric: mood appropriate, affect full  Female chaperone present for breast  portions of the physical exam  Assessment: 23  y.o. G1P1001 follow up mastodynia   Plan: Problem List Items Addressed This Visit    None    Visit Diagnoses    Mastodynia of left breast    -  Primary     1) Mastodynia - continue NSAID prn.  No concerning findings to warrant imaging at this time although this was offered to the patient should she continue to express concern.  2) A total of 15 minutes were spent in face-to-face contact with the patient during this encounter with over half of that time devoted to counseling and coordination of care.  3) Return in about 1 year (around 11/11/2019) for annual.   Malachy Mood, MD, [place], [place] 11/11/2018, 9:31 AM

## 2019-02-01 ENCOUNTER — Encounter: Payer: Self-pay | Admitting: Advanced Practice Midwife

## 2019-02-01 ENCOUNTER — Ambulatory Visit (INDEPENDENT_AMBULATORY_CARE_PROVIDER_SITE_OTHER): Payer: Managed Care, Other (non HMO) | Admitting: Advanced Practice Midwife

## 2019-02-01 ENCOUNTER — Other Ambulatory Visit (HOSPITAL_COMMUNITY)
Admission: RE | Admit: 2019-02-01 | Discharge: 2019-02-01 | Disposition: A | Discharge status: Home / Self Care | Payer: Managed Care, Other (non HMO) | Source: Ambulatory Visit | Attending: Advanced Practice Midwife | Admitting: Advanced Practice Midwife

## 2019-02-01 ENCOUNTER — Other Ambulatory Visit: Payer: Self-pay

## 2019-02-01 VITALS — BP 122/74 | Wt 215.0 lb

## 2019-02-01 DIAGNOSIS — Z3480 Encounter for supervision of other normal pregnancy, unspecified trimester: Secondary | ICD-10-CM | POA: Insufficient documentation

## 2019-02-01 DIAGNOSIS — Z3A08 8 weeks gestation of pregnancy: Secondary | ICD-10-CM

## 2019-02-01 DIAGNOSIS — Z131 Encounter for screening for diabetes mellitus: Secondary | ICD-10-CM

## 2019-02-01 DIAGNOSIS — Z113 Encounter for screening for infections with a predominantly sexual mode of transmission: Secondary | ICD-10-CM | POA: Insufficient documentation

## 2019-02-01 DIAGNOSIS — O99210 Obesity complicating pregnancy, unspecified trimester: Secondary | ICD-10-CM | POA: Insufficient documentation

## 2019-02-01 DIAGNOSIS — Z6836 Body mass index (BMI) 36.0-36.9, adult: Secondary | ICD-10-CM

## 2019-02-01 NOTE — Progress Notes (Signed)
New Obstetric Patient H&P    Chief Complaint: "Desires prenatal care"   History of Present Illness: Patient is a 23 y.o. G2P1001 Not Hispanic or Latino female, presents with amenorrhea and positive home pregnancy test. Patient's last menstrual period was 12/05/2018 (within days). and based on her  LMP, her EDD is Estimated Date of Delivery: 09/11/19 and her EGA is 3266w2d. Cycles are 5. days, regular, and occur approximately every : 28 days. Her last pap smear was 4 months ago and was ASCUS with POSITIVE high risk HPV followed by a normal colposcopy.    She had a urine pregnancy test which was positive 2 week(s)  ago. Her last menstrual period was normal and lasted for  5 day(s). Since her LMP she claims she has experienced breast tenderness. She denies vaginal bleeding. Her past medical history is noncontributory. Her prior pregnancies are notable for G1 2016 with threatened PT labor and delivery at 38 wks, SVD, 8 pounds  Since her LMP, she admits to the use of tobacco products  no She claims she has gained   10 pounds since the start of her pregnancy.  There are cats in the home in the home  yes If yes Indoor- advised not to change litterbox She admits close contact with children on a regular basis  yes  She has had chicken pox in the past unknown She has had Tuberculosis exposures, symptoms, or previously tested positive for TB   no Current or past history of domestic violence. no  Genetic Screening/Teratology Counseling: (Includes patient, baby's father, or anyone in either family with:)   1. [facility]  no 2. Thalassemia ([place], [place], Mediterranean, or Asian background): MCV<80  no 3. Neural tube defect (meningomyelocele, spina bifida, anencephaly)  no 4. Congenital heart defect  no  5. Down syndrome  no 6. Tay-Sachs (Jewish, [place] Canadian)  no 7. Canavan's Disease  no 8. Sickle cell disease or trait (African)  no  9. Hemophilia or other blood disorders  no  10.  Muscular dystrophy  no  11. Cystic fibrosis  no  12. Huntington's Chorea  no  13. Mental retardation/autism  no 14. Other inherited genetic or chromosomal disorder  no 15. Maternal metabolic disorder (DM, PKU, etc)  no 16. Patient or FOB with a child with a birth defect not listed above no  16a. Patient or FOB with a birth defect themselves no 17. Recurrent pregnancy loss, or stillbirth  no  18. Any medications since LMP other than prenatal vitamins (include vitamins, supplements, OTC meds, drugs, alcohol)  no 19. Any other genetic/environmental exposure to discuss  no  Infection History:   1. Lives with someone with TB or TB exposed  no  2. Patient or partner has history of genital herpes  no 3. Rash or viral illness since LMP  no 4. History of STI (GC, CT, HPV, syphilis, HIV)  no 5. History of recent travel :  no  Other pertinent information:  no     Review of Systems:10 point review of systems negative unless otherwise noted in HPI  Past Medical History:  Past Medical History:  Diagnosis Date  . Anxiety   . Depression     Past Surgical History:  Past Surgical History:  Procedure Laterality Date  . COLPOSCOPY  10/28/2018  . NO PAST SURGERIES      Gynecologic History: Patient's last menstrual period was 12/05/2018 (within days).  Obstetric History: G2P1001  Family History:  Family History  Problem Relation Age of Onset  . Schizophrenia Brother   . Breast cancer Paternal Grandmother   . Cancer Maternal Grandfather 60       COLON  . Cancer Other   . Diabetes Other        TYPE 2  . Depression Other     Social History:  Social History   Socioeconomic History  . Marital status: Single    Spouse name: Not on file  . Number of children: 1  . Years of education: 6  . Highest education level: Not on file  Occupational History  . Occupation: UNEMPLOYED  Social Needs  . Financial resource strain: Not on file  . Food insecurity    Worry: Not on file     Inability: Not on file  . Transportation needs    Medical: Not on file    Non-medical: Not on file  Tobacco Use  . Smoking status: Former Smoker    Packs/day: 0.25    Years: 5.00    Pack years: 1.25    Types: Cigarettes    Quit date: 09/30/2014    Years since quitting: 4.3  . Smokeless tobacco: Never Used  Substance and Sexual Activity  . Alcohol use: No    Alcohol/week: 0.0 standard drinks  . Drug use: Yes    Types: Marijuana    Comment: positive test this pregnancy  . Sexual activity: Yes    Birth control/protection: None  Lifestyle  . Physical activity    Days per week: Not on file    Minutes per session: Not on file  . Stress: Not on file  Relationships  . Social Herbalist on phone: Not on file    Gets together: Not on file    Attends religious service: Not on file    Active member of club or organization: Not on file    Attends meetings of clubs or organizations: Not on file    Relationship status: Not on file  . Intimate partner violence    Fear of current or ex partner: Not on file    Emotionally abused: Not on file    Physically abused: Not on file    Forced sexual activity: Not on file  Other Topics Concern  . Not on file  Social History Narrative  . Not on file    Allergies:  Allergies  Allergen Reactions  . Amoxicillin Hives  . Penicillin G Hives    Medications: Prior to Admission medications   Medication Sig Start Date End Date Taking? Authorizing Provider  Prenatal Vit-Fe Fumarate-FA (MULTIVITAMIN-PRENATAL) 27-0.8 MG TABS tablet Take 1 tablet by mouth daily at 12 noon.   Yes [provider]  ibuprofen (ADVIL) 800 MG tablet Take 1 tablet (800 mg total) by mouth every 8 (eight) hours as needed. Patient not taking: Reported on 02/01/2019 10/02/18   Malachy Mood, MD    Physical Exam Vitals: Blood pressure 122/74, weight 215 lb (97.5 kg), last menstrual period 01/05/2019  General: NAD HEENT: normocephalic, anicteric  Thyroid: no enlargement, no palpable nodules Pulmonary: No increased work of breathing, CTAB Cardiovascular: RRR, distal pulses 2+ Abdomen: NABS, soft, non-tender, non-distended.  Umbilicus without lesions.  No hepatomegaly, splenomegaly or masses palpable. No evidence of hernia  Genitourinary: deferred for PAP interval and no concerns Extremities: no edema, erythema, or tenderness Neurologic: Grossly intact Psychiatric: mood appropriate, affect full   Assessment: 23 y.o. G2P1001 at [redacted]w[redacted]d by approximate LMP presenting to initiate prenatal care  Plan: 1)  Avoid alcoholic beverages. 2) Patient encouraged not to smoke.  3) Discontinue the use of all non-medicinal drugs and chemicals.  4) Take prenatal vitamins daily.  5) Nutrition, food safety (fish, cheese advisories, and high nitrite foods) and exercise discussed. 6) Hospital and practice style discussed with cross coverage system.  7) Genetic Screening, such as with 1st Trimester Screening, cell free fetal DNA, AFP testing, and Ultrasound, as well as with amniocentesis and CVS as appropriate, is discussed with patient. At the conclusion of today's visit patient declined genetic testing 8) Patient is asked about travel to areas at risk for the [place] virus, and counseled to avoid travel and exposure to mosquitoes or sexual partners who may have themselves been exposed to the virus. Testing is discussed, and will be ordered as appropriate.  9) Urine culture and urine aptima ordered today 10 Return to clinic in 1 week for early 1 hr gtt, NOB panel, dating scan and rob   Tresea Mall, CNM [facility] 02/01/2019, 2:41 PM

## 2019-02-01 NOTE — Patient Instructions (Signed)
Exercise During Pregnancy Exercise is an important part of being healthy for people of all ages. Exercise improves the function of your heart and lungs and helps you maintain strength, flexibility, and a healthy body weight. Exercise also boosts energy levels and elevates mood. Most women should exercise regularly during pregnancy. In rare cases, women with certain medical conditions or complications may be asked to limit or avoid exercise during pregnancy. How does this affect me? Along with maintaining general strength and flexibility, exercising during pregnancy can help:  Keep strength in muscles that are used during labor and childbirth.  Decrease low back pain.  Reduce symptoms of depression.  Control weight gain during pregnancy.  Reduce the risk of needing insulin if you develop diabetes during pregnancy.  Decrease the risk of cesarean delivery.  Speed up your recovery after giving birth. How does this affect my baby? Exercise can help you have a healthy pregnancy. Exercise does not cause premature birth. It will not cause your baby to weigh less at birth. What exercises can I do? Many exercises are safe for you to do during pregnancy. Do a variety of exercises that safely increase your heart and breathing rates and help you build and maintain muscle strength. Do exercises exactly as told by your health care provider. You may do these exercises:  Walking or hiking.  Swimming.  Water aerobics.  Riding a stationary bike.  Strength training.  Modified yoga or Pilates. Tell your instructor that you are pregnant. Avoid overstretching, and avoid lying on your back for long periods of time.  Running or jogging. Only choose this type of exercise if you: ? Ran or jogged regularly before your pregnancy. ? Can run or jog and still talk in complete sentences. What exercises should I avoid? Depending on your level of fitness and whether you exercised regularly before your  pregnancy, you may be told to limit high-intensity exercise. You can tell that you are exercising at a high intensity if you are breathing much harder and faster and cannot hold a conversation while exercising. You must avoid:  Contact sports.  Activities that put you at risk for falling on or being hit in the belly, such as downhill skiing, water skiing, surfing, rock climbing, cycling, gymnastics, and horseback riding.  Scuba diving.  Skydiving.  Yoga or Pilates in a room that is heated to high temperatures.  Jogging or running, unless you ran or jogged regularly before your pregnancy. While jogging or running, you should always be able to talk in full sentences. Do not run or jog so fast that you are unable to have a conversation.  Do not exercise at more than 6,000 feet above sea level (high elevation) if you are not used to exercising at high elevation. How do I exercise in a safe way?   Avoid overheating. Do not exercise in very high temperatures.  Wear loose-fitting, breathable clothes.  Avoid dehydration. Drink enough water before, during, and after exercise to keep your urine pale yellow.  Avoid overstretching. Because of hormone changes during pregnancy, it is easy to overstretch muscles, tendons, and ligaments during pregnancy.  Start slowly and ask your health care provider to recommend the types of exercise that are safe for you.  Do not exercise to lose weight. Follow these instructions at home:  Exercise on most days or all days of the week. Try to exercise for 30 minutes a day, 5 days a week, unless your health care provider tells you not to.  If   you actively exercised before your pregnancy and you are healthy, your health care provider may tell you to continue to do moderate to high-intensity exercise.  If you are just starting to exercise or did not exercise much before your pregnancy, your health care provider may tell you to do low to moderate-intensity  exercise. Questions to ask your health care provider  Is exercise safe for me?  What are signs that I should stop exercising?  Does my health condition mean that I should not exercise during pregnancy?  When should I avoid exercising during pregnancy? Stop exercising and contact a health care provider if: You have any unusual symptoms, such as:  Mild contractions of the uterus or cramps in the abdomen.  Dizziness that does not go away when you rest. Stop exercising and get help right away if: You have any unusual symptoms, such as:  Sudden, severe pain in your low back or your belly.  Mild contractions of the uterus or cramps in the abdomen that do not improve with rest and drinking fluids.  Chest pain.  Bleeding or fluid leaking from your vagina.  Shortness of breath. These symptoms may represent a serious problem that is an emergency. Do not wait to see if the symptoms will go away. Get medical help right away. Call your local emergency services (911 in the [place] Do not drive yourself to the hospital. Summary  Most women should exercise regularly throughout pregnancy. In rare cases, women with certain medical conditions or complications may be asked to limit or avoid exercise during pregnancy.  Do not exercise to lose weight during pregnancy.  Your health care provider will tell you what level of physical activity is right for you.  Stop exercising and contact a health care provider if you have mild contractions of the uterus or cramps in the abdomen. Get help right away if these contractions or cramps do not improve with rest and drinking fluids.  Stop exercising and get help right away if you have sudden, severe pain in your low back or belly, chest pain, shortness of breath, or bleeding or leaking of fluid from your vagina. This information is not intended to replace advice given to you by your health care provider. Make sure you discuss any questions you have with your  health care provider. Document Released: 04/29/2005 Document Revised: 08/20/2018 Document Reviewed: 06/03/2018 Elsevier Patient Education  2020 [employer] Eating Plan for Pregnant Women While you are pregnant, your body requires additional nutrition to help support your growing baby. You also have a higher need for some vitamins and minerals, such as folic acid, calcium, iron, and vitamin D. Eating a healthy, well-balanced diet is very important for your health and your baby's health. Your need for extra calories varies for the three 3-month segments of your pregnancy (trimesters). For most women, it is recommended to consume:  150 extra calories a day during the first trimester.  300 extra calories a day during the second trimester.  300 extra calories a day during the third trimester. What are tips for following this plan?   Do not try to lose weight or go on a diet during pregnancy.  Limit your overall intake of foods that have "empty calories." These are foods that have little nutritional value, such as sweets, desserts, candies, and sugar-sweetened beverages.  Eat a variety of foods (especially fruits and vegetables) to get a full range of vitamins and minerals.  Take a prenatal vitamin to help meet your additional vitamin   and mineral needs during pregnancy, specifically for folic acid, iron, calcium, and vitamin D.  Remember to stay active. Ask your health care provider what types of exercise and activities are safe for you.  Practice good food safety and cleanliness. Wash your hands before you eat and after you prepare raw meat. Wash all fruits and vegetables well before peeling or eating. Taking these actions can help to prevent food-borne illnesses that can be very dangerous to your baby, such as listeriosis. Ask your health care provider for more information about listeriosis. What does 150 extra calories look like? Healthy options that provide 150 extra calories each day  could be any of the following:  6-8 oz (170-230 g) of plain low-fat yogurt with  cup of berries.  1 apple with 2 teaspoons (11 g) of peanut butter.  Cut-up vegetables with  cup (60 g) of hummus.  8 oz (230 mL) or 1 cup of low-fat chocolate milk.  1 stick of string cheese with 1 medium orange.  1 peanut butter and jelly sandwich that is made with one slice of whole-wheat bread and 1 tsp (5 g) of peanut butter. For 300 extra calories, you could eat two of those healthy options each day. What is a healthy amount of weight to gain? The right amount of weight gain for you is based on your BMI before you became pregnant. If your BMI:  Was less than 18 (underweight), you should gain 28-40 lb (13-18 kg).  Was 18-24.9 (normal), you should gain 25-35 lb (11-16 kg).  Was 25-29.9 (overweight), you should gain 15-25 lb (7-11 kg).  Was 30 or greater (obese), you should gain 11-20 lb (5-9 kg). What if I am having twins or multiples? Generally, if you are carrying twins or multiples:  You may need to eat 300-600 extra calories a day.  The recommended range for total weight gain is 25-54 lb (11-25 kg), depending on your BMI before pregnancy.  Talk with your health care provider to find out about nutritional needs, weight gain, and exercise that is right for you. What foods can I eat?  Grains All grains. Choose whole grains, such as whole-wheat bread, oatmeal, or brown rice. Vegetables All vegetables. Eat a variety of colors and types of vegetables. Remember to wash your vegetables well before peeling or eating. Fruits All fruits. Eat a variety of colors and types of fruit. Remember to wash your fruits well before peeling or eating. Meats and other protein foods Lean meats, including chicken, turkey, fish, and lean cuts of beef, veal, or pork. If you eat fish or seafood, choose options that are higher in omega-3 fatty acids and lower in mercury, such as salmon, herring, mussels, trout,  sardines, pollock, shrimp, crab, and lobster. Tofu. Tempeh. Beans. Eggs. Peanut butter and other nut butters. Make sure that all meats, poultry, and eggs are cooked to food-safe temperatures or "well-done." Two or more servings of fish are recommended each week in order to get the most benefits from omega-3 fatty acids that are found in seafood. Choose fish that are lower in mercury. You can find more information online:  www.fda.gov Dairy Pasteurized milk and milk alternatives (such as almond milk). Pasteurized yogurt and pasteurized cheese. Cottage cheese. Sour cream. Beverages Water. Juices that contain 100% fruit juice or vegetable juice. Caffeine-free teas and decaffeinated coffee. Drinks that contain caffeine are okay to drink, but it is better to avoid caffeine. Keep your total caffeine intake to less than 200 mg each day (which   is 12 oz or 355 mL of coffee, tea, or soda) or the limit as told by your health care provider. Fats and oils Fats and oils are okay to include in moderation. Sweets and desserts Sweets and desserts are okay to include in moderation. Seasoning and other foods All pasteurized condiments. The items listed above may not be a complete list of recommended foods and beverages. Contact your dietitian for more options. The items listed above may not be a complete list of foods and beverages [you/your child] can eat. Contact a dietitian for more information. What foods are not recommended? Vegetables Raw (unpasteurized) vegetable juices. Fruits Unpasteurized fruit juices. Meats and other protein foods Lunch meats, bologna, hot dogs, or other deli meats. (If you must eat those meats, reheat them until they are steaming hot.) Refrigerated pat, meat spreads from a meat counter, smoked seafood that is found in the refrigerated section of a store. Raw or undercooked meats, poultry, and eggs. Raw fish, such as sushi or sashimi. Fish that have high mercury content, such as  tilefish, shark, swordfish, and king mackerel. To learn more about mercury in fish, talk with your health care provider or look for online resources, such as:  www.fda.gov Dairy Raw (unpasteurized) milk and any foods that have raw milk in them. Soft cheeses, such as feta, queso blanco, queso fresco, Brie, Camembert cheeses, blue-veined cheeses, and Panela cheese (unless it is made with pasteurized milk, which must be stated on the label). Beverages Alcohol. Sugar-sweetened beverages, such as sodas, teas, or energy drinks. Seasoning and other foods Homemade fermented foods and drinks, such as pickles, sauerkraut, or kombucha drinks. (Store-bought pasteurized versions of these are okay.) Salads that are made in a store or deli, such as ham salad, chicken salad, egg salad, tuna salad, and seafood salad. The items listed above may not be a complete list of foods and beverages to avoid. Contact your dietitian for more information. The items listed above may not be a complete list of foods and beverages [you/your child] should avoid. Contact a dietitian for more information. Where to find more information To calculate the number of calories you need based on your height, weight, and activity level, you can use an online calculator such as:  www.choosemyplate.gov/MyPlatePlan To calculate how much weight you should gain during pregnancy, you can use an online pregnancy weight gain calculator such as:  www.choosemyplate.gov/pregnancy-weight-gain-calculator Summary  While you are pregnant, your body requires additional nutrition to help support your growing baby.  Eat a variety of foods, especially fruits and vegetables to get a full range of vitamins and minerals.  Practice good food safety and cleanliness. Wash your hands before you eat and after you prepare raw meat. Wash all fruits and vegetables well before peeling or eating. Taking these actions can help to prevent food-borne illnesses, such as  listeriosis, that can be very dangerous to your baby.  Do not eat raw meat or fish. Do not eat fish that have high mercury content, such as tilefish, shark, swordfish, and king mackerel. Do not eat unpasteurized (raw) dairy.  Take a prenatal vitamin to help meet your additional vitamin and mineral needs during pregnancy, specifically for folic acid, iron, calcium, and vitamin D. This information is not intended to replace advice given to you by your health care provider. Make sure you discuss any questions you have with your health care provider. Document Released: 02/11/2014 Document Revised: 08/20/2018 Document Reviewed: 01/24/2017 Elsevier Patient Education  2020 [employer] Inc. Prenatal Care Prenatal care   is health care during pregnancy. It helps you and your unborn baby (fetus) stay as healthy as possible. Prenatal care may be provided by a midwife, a family practice health care provider, or a childbirth and pregnancy specialist (obstetrician). How does this affect me? During pregnancy, you will be closely monitored for any new conditions that might develop. To lower your risk of pregnancy complications, you and your health care provider will talk about any underlying conditions you have. How does this affect my baby? Early and consistent prenatal care increases the chance that your baby will be healthy during pregnancy. Prenatal care lowers the risk that your baby will be:  Born early (prematurely).  Smaller than expected at birth (small for gestational age). What can I expect at the first prenatal care visit? Your first prenatal care visit will likely be the longest. You should schedule your first prenatal care visit as soon as you know that you are pregnant. Your first visit is a good time to talk about any questions or concerns you have about pregnancy. At your visit, you and your health care provider will talk about:  Your medical history, including: ? Any past pregnancies. ? Your  family's medical history. ? The baby's father's medical history. ? Any long-term (chronic) health conditions you have and how you manage them. ? Any surgeries or procedures you have had. ? Any current over-the-counter or prescription medicines, herbs, or supplements you are taking.  Other factors that could pose a risk to your baby, including:  Your home setting and your stress levels, including: ? Exposure to abuse or violence. ? Household financial strain. ? Mental health conditions you have.  Your daily health habits, including diet and exercise. Your health care provider will also:  Measure your weight, height, and blood pressure.  Do a physical exam, including a pelvic and breast exam.  Perform blood tests and urine tests to check for: ? Urinary tract infection. ? Sexually transmitted infections (STIs). ? Low iron levels in your blood (anemia). ? Blood type and certain proteins on red blood cells (Rh antibodies). ? Infections and immunity to viruses, such as hepatitis B and rubella. ? HIV (human immunodeficiency virus).  Do an ultrasound to confirm your baby's growth and development and to help predict your estimated due date (EDD). This ultrasound is done with a probe that is inserted into the vagina (transvaginal ultrasound).  Discuss your options for genetic screening.  Give you information about how to keep yourself and your baby healthy, including: ? Nutrition and taking vitamins. ? Physical activity. ? How to manage pregnancy symptoms such as nausea and vomiting (morning sickness). ? Infections and substances that may be harmful to your baby and how to avoid them. ? Food safety. ? Dental care. ? Working. ? Travel. ? Warning signs to watch for and when to call your health care provider. How often will I have prenatal care visits? After your first prenatal care visit, you will have regular visits throughout your pregnancy. The visit schedule is often as follows:   Up to week 28 of pregnancy: once every 4 weeks.  28-36 weeks: once every 2 weeks.  After 36 weeks: every week until delivery. Some women may have visits more or less often depending on any underlying health conditions and the health of the baby. Keep all follow-up and prenatal care visits as told by your health care provider. This is important. What happens during routine prenatal care visits? Your health care provider will:  Measure your   weight and blood pressure.  Check for fetal heart sounds.  Measure the height of your uterus in your abdomen (fundal height). This may be measured starting around week 20 of pregnancy.  Check the position of your baby inside your uterus.  Ask questions about your diet, sleeping patterns, and whether you can feel the baby move.  Review warning signs to watch for and signs of labor.  Ask about any pregnancy symptoms you are having and how you are dealing with them. Symptoms may include: ? Headaches. ? Nausea and vomiting. ? Vaginal discharge. ? Swelling. ? Fatigue. ? Constipation. ? Any discomfort, including back or pelvic pain. Make a list of questions to ask your health care provider at your routine visits. What tests might I have during prenatal care visits? You may have blood, urine, and imaging tests throughout your pregnancy, such as:  Urine tests to check for glucose, protein, or signs of infection.  Glucose tests to check for a form of diabetes that can develop during pregnancy (gestational diabetes mellitus). This is usually done around week 24 of pregnancy.  An ultrasound to check your baby's growth and development and to check for birth defects. This is usually done around week 20 of pregnancy.  A test to check for group B strep (GBS) infection. This is usually done around week 36 of pregnancy.  Genetic testing. This may include blood or imaging tests, such as an ultrasound. Some genetic tests are done during the first trimester  and some are done during the second trimester. What else can I expect during prenatal care visits? Your health care provider may recommend getting certain vaccines during pregnancy. These may include:  A yearly flu shot (annual influenza vaccine). This is especially important if you will be pregnant during flu season.  Tdap (tetanus, diphtheria, pertussis) vaccine. Getting this vaccine during pregnancy can protect your baby from whooping cough (pertussis) after birth. This vaccine may be recommended between weeks 27 and 36 of pregnancy. Later in your pregnancy, your health care provider may give you information about:  Childbirth and breastfeeding classes.  Choosing a health care provider for your baby.  Umbilical cord banking.  Breastfeeding.  Birth control after your baby is born.  The hospital labor and delivery unit and how to tour it.  Registering at the hospital before you go into labor. Where to find more information  Office on Women's Health: womenshealth.gov  American Pregnancy Association: americanpregnancy.org  March of Dimes: marchofdimes.org Summary  Prenatal care helps you and your baby stay as healthy as possible during pregnancy.  Your first prenatal care visit will most likely be the longest.  You will have visits and tests throughout your pregnancy to monitor your health and your baby's health.  Bring a list of questions to your visits to ask your health care provider.  Make sure to keep all follow-up and prenatal care visits with your health care provider. This information is not intended to replace advice given to you by your health care provider. Make sure you discuss any questions you have with your health care provider. Document Released: 05/02/2003 Document Revised: 08/19/2018 Document Reviewed: 04/28/2017 Elsevier Patient Education  2020 Else[employer] Inc.    COVID-19 and Your Pregnancy FAQ  How can I prevent infection with COVID-19 during my  pregnancy? Social distancing is key. Please limit any interactions in public. Try and work from home if possible. Frequently wash your hands after touching possibly contaminated surfaces. Avoid touching your face.  Minimize trips to   the store. Consider online ordering when possible.   Should I wear a mask? YES. It is recommended by the CDC that all people wear a cloth mask or facial covering in public. You should wear a mask to your visits in the office. This will help reduce transmission as well as your risk or acquiring COVID-19. New studies are showing that even asymptomatic individuals can spread the virus from talking.   Where can I get a mask?  and the city of [place] are partnering to provide masks to community members. You can pick up a mask from several locations. This website also has instructions about how to make a mask by sewing or without sewing by using a t-shirt or bandana.  https://www.[place].gov/i-want-to/learn-about/covid-19-information-and-updates/covid-19-face-mask-project  Studies have shown that if you were a tube or nylon stocking from pantyhose over a cloth mask it makes the cloth mask almost as effective as a N95 mask.  https://www.npr.org/sections/goatsandsoda/2018/09/02/840146830/adding-a-nylon-stocking-layer-could-boost-protection-from-cloth-masks-study-find  What are the symptoms of COVID-19? Fever (greater than 100.4 F), dry cough, shortness of breath.  Am I more at risk for COVID-19 since I am pregnant? There is not currently data showing that pregnant women are more adversely impacted by COVID-19 than the general population. However, we know that pregnant women tend to have worse respiratory complications from similar diseases such as the flu and SARS and for this reason should be considered an at-risk population.  What do I do if I am experiencing the symptoms of COVID-19? Testing is being limited because of test availability. If you are  experiencing symptoms you should quarantine yourself, and the members of your family, for at least 2 weeks at home.   Please visit this website for more information: https://www.cdc.gov/coronavirus/2019-ncov/if-you-are-sick/steps-when-sick.html  When should I go to the Emergency Room? Please go to the emergency room if you are experiencing ANY of these symptoms*:  1.    Difficulty breathing or shortness of breath 2.    Persistent pain or pressure in the chest 3.    Confusion or difficulty being aroused (or awakened) 4.    Bluish lips or face  *This list is not all inclusive. Please consult our office for any other symptoms that are severe or concerning.  What do I do if I am having difficulty breathing? You should go to the Emergency Room for evaluation. At this time they have a tent set up for evaluating patients with COVID-19 symptoms.   How will my prenatal care be different because of the COVID-19 pandemic? It has been recommended to reduce the frequency of face-to-face visits and use resources such as telephone and virtual visits when possible. Using a scale, blood pressure machine and fetal doppler at home can further help reduce face-to-face visits. You will be provided with additional information on this topic.  We ask that you come to your visits alone to minimize potential exposures to  COVID-19.  How can I receive childbirth education? At this time in-person classes have been cancelled. You can register for online childbirth education, breastfeeding, and newborn care classes.  Please visit:  [email or web address] for more information  How will my hospital birth experience be different? The hospital is currently limiting visitors. This means that while you are in labor you can only have one person at the hospital with you. Additional family members will not be allowed to wait in the building or outside your room. Your one support person can be the father of the baby, a  relative, a doula, or a friend. Once one support person   is designated that person will wear a band. This band cannot be shared with multiple people.  Nitrous Gas is not being offered for pain relief since the tubing and filter for the machine can not be sanitized in a way to guarantee prevention of transmission of COVID-19.  Nasal cannula use of oxygen for fetal indications has also been discontinued.  Currently a clear plastic sheet is being hung between mom and the delivering provider during pushing and delivery to help prevent transmission of COVID-19.      How long will I stay in the hospital for after giving birth? It is also recommended that discharge home be expedited during the COVID-19 outbreak. This means staying for 1 day after a vaginal delivery and 2 days after a cesarean section. Patients who need to stay longer for medical reasons are allowed to do so, but the goal will be for expedited discharge home.   What if I have COVID-19 and I am in labor? We ask that you wear a mask while on labor and delivery. We will try and accommodate you being placed in a room that is capable of filtering the air. Please call ahead if you are in labor and on your way to the hospital. The phone number for labor and delivery at [facility] is [phone].  If I have COVID-19 when my baby is born how can I prevent my baby from contracting COVID-19? This is an issue that will have to be discussed on a case-by-case basis. Current recommendations suggest providing separate isolation rooms for both the mother and new infant as well as limiting visitors. However, there are practical challenges to this recommendation. The situation will assuredly change and decisions will be influenced by the desires of the mother and availability of space.  Some suggestions are the use of a curtain or physical barrier between mom and infant, hand hygiene, mom wearing a mask, or 6 feet of spacing between a  mom and infant.   Can I breastfeed during the COVID-19 pandemic?   Yes, breastfeeding is encouraged.  Can I breastfeed if I have COVID-19? Yes. Covid-19 has not been found in breast milk. This means you cannot give COVID-19 to your child through breast milk. Breast feeding will also help pass antibodies to fight infection to your baby.   What precautions should I take when breastfeeding if I have COVID-19? If a mother and newborn do room-in and the mother wishes to feed at the breast, she should put on a facemask and practice hand hygiene before each feeding.  What precautions should I take when pumping if I have COVID-19? Prior to expressing breast milk, mothers should practice hand hygiene. After each pumping session, all parts that come into contact with breast milk should be thoroughly washed and the entire pump should be appropriately disinfected per the manufacturer's instructions. This expressed breast milk should be fed to the newborn by a healthy caregiver.  What if I am pregnant and work in healthcare? Based on limited data regarding COVID-19 and pregnancy, ACOG currently does not propose creating additional restrictions on pregnant health care personnel because of COVID-19 alone. Pregnant women do not appear to be at higher risk of severe disease related to COVID-19. Pregnant health care personnel should follow CDC risk assessment and infection control guidelines for health care personnel exposed to patients with suspected or confirmed COVID-19. Adherence to recommended infection prevention and control practices is an important part of protecting all health care personnel in   health care settings.    Information on COVID-19 in pregnancy is very limited; however, facilities may want to consider limiting exposure of pregnant health care personnel to patients with confirmed or suspected COVID-19 infection, especially during higher-risk procedures (eg, aerosol-generating procedures), if  feasible, based on staffing availability.    

## 2019-02-01 NOTE — Progress Notes (Signed)
NOB today. LMP: 01/05/2019 

## 2019-02-03 LAB — URINE CULTURE

## 2019-02-08 ENCOUNTER — Ambulatory Visit (INDEPENDENT_AMBULATORY_CARE_PROVIDER_SITE_OTHER): Payer: Managed Care, Other (non HMO)

## 2019-02-08 ENCOUNTER — Encounter: Payer: Self-pay | Admitting: Obstetrics and Gynecology

## 2019-02-08 ENCOUNTER — Other Ambulatory Visit: Payer: Managed Care, Other (non HMO)

## 2019-02-08 ENCOUNTER — Ambulatory Visit (INDEPENDENT_AMBULATORY_CARE_PROVIDER_SITE_OTHER): Payer: Managed Care, Other (non HMO) | Admitting: Obstetrics and Gynecology

## 2019-02-08 ENCOUNTER — Other Ambulatory Visit: Payer: Self-pay

## 2019-02-08 DIAGNOSIS — Z113 Encounter for screening for infections with a predominantly sexual mode of transmission: Secondary | ICD-10-CM

## 2019-02-08 DIAGNOSIS — Z3A08 8 weeks gestation of pregnancy: Secondary | ICD-10-CM

## 2019-02-08 DIAGNOSIS — Z6836 Body mass index (BMI) 36.0-36.9, adult: Secondary | ICD-10-CM

## 2019-02-08 DIAGNOSIS — Z131 Encounter for screening for diabetes mellitus: Secondary | ICD-10-CM

## 2019-02-08 DIAGNOSIS — O99210 Obesity complicating pregnancy, unspecified trimester: Secondary | ICD-10-CM

## 2019-02-08 DIAGNOSIS — Z3480 Encounter for supervision of other normal pregnancy, unspecified trimester: Secondary | ICD-10-CM

## 2019-02-08 DIAGNOSIS — O3481 Maternal care for other abnormalities of pelvic organs, first trimester: Secondary | ICD-10-CM

## 2019-02-08 DIAGNOSIS — O99213 Obesity complicating pregnancy, third trimester: Secondary | ICD-10-CM

## 2019-02-08 DIAGNOSIS — N8312 Corpus luteum cyst of left ovary: Secondary | ICD-10-CM | POA: Diagnosis not present

## 2019-02-08 NOTE — Progress Notes (Signed)
Routine Prenatal Care Visit  Subjective  Kristen Hughes is a 23 y.o. G2P1001 at [redacted]w[redacted]d being seen today for ongoing prenatal care.  She is currently monitored for the following issues for this high-risk pregnancy and has Supervision of other normal pregnancy, antepartum; Obesity affecting pregnancy, antepartum; and BMI 36.0-36.9,adult on their problem list.  ----------------------------------------------------------------------------------- Patient reports no complaints.    . Vag. Bleeding: None.   . Leaking Fluid denies.  U/S changes EDD to 09/19/2019. ----------------------------------------------------------------------------------- The following portions of the patient's history were reviewed and updated as appropriate: allergies, current medications, past family history, past medical history, past social history, past surgical history and problem list. Problem list updated.  Objective  Blood pressure 126/74, weight 214 lb (97.1 kg), last menstrual period 12/05/2018, unknown if currently breastfeeding. Pregravid weight 215 lb (97.5 kg) Total Weight Gain -1 lb (-0.454 kg) Urinalysis: Urine Protein    Urine Glucose    Fetal Status: Fetal Heart Rate (bpm): 166         General:  Alert, oriented and cooperative. Patient is in no acute distress.  Skin: Skin is warm and dry. No rash noted.   Cardiovascular: Normal heart rate noted  Respiratory: Normal respiratory effort, no problems with respiration noted  Abdomen: Soft, gravid, appropriate for gestational age. Pain/Pressure: Absent     Pelvic:  Cervical exam deferred        Extremities: Normal range of motion.     Mental Status: Normal mood and affect. Normal behavior. Normal judgment and thought content.   Imaging Results US Ob Comp Less 14 Wks  Result Date: 02/08/2019 Patient Name: Kristen Hughes DOB: 1995/07/17 MRN: [id number] ULTRASOUND REPORT Location: [facility] Date of Service: 02/08/2019 Indications:dating Findings:  Mason Jim intrauterine pregnancy is visualized with a CRL consistent with [redacted]w[redacted]d gestation, giving an (U/S) EDD of 09/19/2019. The (U/S) EDD is not consistent with the clinically established EDD of 09/11/2019. FHR: 169 BPM CRL measurement: 17.1 mm Yolk sac is visualized and appears normal and early anatomy is normal. Amnion: visualized and appears normal Right Ovary is normal in appearance. Left Ovary is normal appearance. Corpus luteal cyst:  Left ovary Survey of the adnexa demonstrates no adnexal masses. There is no free peritoneal fluid in the cul de sac. Impression: 1. [redacted]w[redacted]d Viable Singleton Intrauterine pregnancy by U/S. 2. (U/S) EDD is not consistent with Clinically established EDD of 09/11/2019.  Ultrasound EDD is 09/19/2019. Deanna Artis, RT There is a viable singleton gestation.  Detailed evaluation of the fetal anatomy is precluded by early gestational age.  It must be noted that a normal ultrasound particular at this early gestational age is unable to rule out fetal aneuploidy, risk of first trimester miscarriage, or anatomic birth defects. Thomasene Mohair, MD, [facility], [facility] 02/08/2019 10:44 AM      Assessment   23 y.o. G2P1001 at [redacted]w[redacted]d by  09/19/2019, by Ultrasound presenting for routine prenatal visit  Plan   pregnancy Problems (from 02/01/19 to present)    Problem Noted Resolved   BMI 36.0-36.9,adult 02/08/2019 by Conard Novak, MD No   Supervision of other normal pregnancy, antepartum 02/01/2019 by Tresea Mall, CNM No   Overview Signed 02/01/2019  2:39 PM by Tresea Mall, CNM    Clinic [facility] Prenatal Labs  Dating  Blood type:     Genetic Screen 1 Screen:    AFP:     Quad:     NIPS: Antibody:   Anatomic [place]  Rubella:   Varicella: @  VZVIGG@  GTT Early:               Third trimester:  RPR: Non Reactive (05/22 1036)   Rhogam  HBsAg: Negative (05/22 1036)   Vaccines TDAP:                       Flu Shot: HIV: Non Reactive (05/22 1036)   Baby Food   Breast                              GBS:   Contraception  Pap: ASCUS/HPV+ May 2020 with normal colposcopy June 2020  CBB     CS/VBAC NA   Support Person Boyfriend Roderic Palau          Obesity affecting pregnancy, antepartum 02/01/2019 by Rod Can, CNM No   Overview Signed 02/08/2019 10:46 AM by Will Bonnet, MD    [ ]  early 1 h gtt          Preterm labor symptoms and general obstetric precautions including but not limited to vaginal bleeding, contractions, leaking of fluid and fetal movement were reviewed in detail with the patient. Please refer to After Visit Summary for other counseling recommendations.   - 1h gtt with NOB labs today.   Return in about 4 weeks (around 03/08/2019) for Routine Prenatal Appointment.  Prentice Docker, MD, [facility], [facility] 02/08/2019 10:49 AM

## 2019-02-09 LAB — RPR+RH+ABO+RUB AB+AB SCR+CB...
Antibody Screen: NEGATIVE
HIV Screen 4th Generation wRfx: NONREACTIVE
Hematocrit: 39.3 % (ref 34.0–46.6)
Hemoglobin: 13.4 g/dL (ref 11.1–15.9)
Hepatitis B Surface Ag: NEGATIVE
MCH: 31.2 pg (ref 26.6–33.0)
MCHC: 34.1 g/dL (ref 31.5–35.7)
MCV: 92 fL (ref 79–97)
Platelets: 284 10*3/uL (ref 150–450)
RBC: 4.29 x10E6/uL (ref 3.77–5.28)
RDW: 11.6 % — ABNORMAL LOW (ref 11.7–15.4)
RPR Ser Ql: NONREACTIVE
Rh Factor: POSITIVE
Rubella Antibodies, IGG: 2.41 index (ref >0.99)
Varicella zoster IgG: 223 index (ref >165)
WBC: 11.4 10*3/uL — ABNORMAL HIGH (ref 3.4–10.8)

## 2019-02-09 LAB — GLUCOSE, 1 HOUR GESTATIONAL: Gestational Diabetes Screen: 124 mg/dL (ref 65–139)

## 2019-02-15 ENCOUNTER — Telehealth: Payer: Self-pay

## 2019-02-15 NOTE — Telephone Encounter (Signed)
Patient returning call, aware KT at lunch. She does have [employer] as well as pregnancy MCD, not a [employer] employee.

## 2019-02-15 NOTE — Telephone Encounter (Signed)
Pt sent a MyChart message asking if she could do the [place]. I have called and left a VM for her to call me back because I know her insurance is Christella Scheuermann however I do not know if she works for [employer] or if she just has that insurance because if she is not a [employer] employee the cost of the test is expensive and I would need to give her information to go through a patient engagement program to lessen the cost.

## 2019-02-15 NOTE — Telephone Encounter (Signed)
Pt left vm to let her know she should be covered through pregnancy medicaid. Advised pt to make sure we get that done for her at her next visit I will also note it in the appt notes.

## 2019-03-08 ENCOUNTER — Ambulatory Visit (INDEPENDENT_AMBULATORY_CARE_PROVIDER_SITE_OTHER): Payer: Managed Care, Other (non HMO) | Admitting: Obstetrics and Gynecology

## 2019-03-08 ENCOUNTER — Encounter: Payer: Self-pay | Admitting: Obstetrics and Gynecology

## 2019-03-08 ENCOUNTER — Other Ambulatory Visit: Payer: Self-pay

## 2019-03-08 VITALS — BP 120/70 | Wt 211.0 lb

## 2019-03-08 DIAGNOSIS — Z3480 Encounter for supervision of other normal pregnancy, unspecified trimester: Secondary | ICD-10-CM

## 2019-03-08 DIAGNOSIS — K2100 Gastro-esophageal reflux disease with esophagitis, without bleeding: Secondary | ICD-10-CM

## 2019-03-08 DIAGNOSIS — O99210 Obesity complicating pregnancy, unspecified trimester: Secondary | ICD-10-CM

## 2019-03-08 DIAGNOSIS — Z3A12 12 weeks gestation of pregnancy: Secondary | ICD-10-CM

## 2019-03-08 DIAGNOSIS — Z23 Encounter for immunization: Secondary | ICD-10-CM | POA: Diagnosis not present

## 2019-03-08 DIAGNOSIS — Z1379 Encounter for other screening for genetic and chromosomal anomalies: Secondary | ICD-10-CM

## 2019-03-08 DIAGNOSIS — O99211 Obesity complicating pregnancy, first trimester: Secondary | ICD-10-CM

## 2019-03-08 DIAGNOSIS — Z6836 Body mass index (BMI) 36.0-36.9, adult: Secondary | ICD-10-CM

## 2019-03-08 MED ORDER — SUCRALFATE 1 G PO TABS: 1.0000 g | ORAL_TABLET | Freq: Three times a day (TID) | ORAL | 11 refills | Status: DC

## 2019-03-08 NOTE — Progress Notes (Signed)
ROB C/o Heartburn and fatigue

## 2019-03-08 NOTE — Progress Notes (Signed)
Routine Prenatal Care Visit  Subjective  Kristen Hughes is a 23 y.o. G2P1001 at [redacted]w[redacted]d being seen today for ongoing prenatal care.  She is currently monitored for the following issues for this low-risk pregnancy and has Supervision of other normal pregnancy, antepartum; Obesity affecting pregnancy, antepartum; and BMI 36.0-36.9,adult on their problem list.  ----------------------------------------------------------------------------------- Patient reports no complaints.   Contractions: Not present. Vag. Bleeding: None.  Movement: Absent. Denies leaking of fluid.  ----------------------------------------------------------------------------------- The following portions of the patient's history were reviewed and updated as appropriate: allergies, current medications, past family history, past medical history, past social history, past surgical history and problem list. Problem list updated.   Objective  Blood pressure 120/70, weight 211 lb (95.7 kg), last menstrual period 12/05/2018, unknown if currently breastfeeding. Pregravid weight 215 lb (97.5 kg) Total Weight Gain -4 lb (-1.814 kg) Urinalysis:      Fetal Status: Fetal Heart Rate (bpm): 160   Movement: Absent     General:  Alert, oriented and cooperative. Patient is in no acute distress.  Skin: Skin is warm and dry. No rash noted.   Cardiovascular: Normal heart rate noted  Respiratory: Normal respiratory effort, no problems with respiration noted  Abdomen: Soft, gravid, appropriate for gestational age. Pain/Pressure: Absent     Pelvic:  Cervical exam deferred        Extremities: Normal range of motion.  Edema: None  Mental Status: Normal mood and affect. Normal behavior. Normal judgment and thought content.     Assessment   23 y.o. G2P1001 at [redacted]w[redacted]d by  09/19/2019, by Ultrasound presenting for routine prenatal visit  Plan   pregnancy Problems (from 02/01/19 to present)    Problem Noted Resolved   BMI 36.0-36.9,adult  02/08/2019 by Conard Novak, MD No   Supervision of other normal pregnancy, antepartum 02/01/2019 by Tresea Mall, CNM No   Overview Signed 02/01/2019  2:39 PM by Tresea Mall, CNM    Clinic [facility] Prenatal Labs  Dating  Blood type:     Genetic Screen 1 Screen:    AFP:     Quad:     NIPS: Antibody:   Anatomic [place]  Rubella:   Varicella: @VZVIGG @  GTT Early:               Third trimester:  RPR: Non Reactive (05/22 1036)   Rhogam  HBsAg: Negative (05/22 1036)   Vaccines TDAP:                       Flu Shot:03/08/2019 HIV: Non Reactive (05/22 1036)   Baby Food  Breast                              GBS:   Contraception  Pap: ASCUS/HPV+ May 2020 with normal colposcopy June 2020  CBB     CS/VBAC NA   Support Person Boyfriend July 2020          Obesity affecting pregnancy, antepartum 02/01/2019 by 02/03/2019, CNM No   Overview Signed 02/08/2019 10:46 AM by 02/10/2019, MD    [ ]  early 1 h gtt          Gestational age appropriate obstetric precautions including but not limited to vaginal bleeding, contractions, leaking of fluid and fetal movement were reviewed in detail with the patient.    Maternit21 testing today FLU shot today Carafate for GERD   Return in about 4 weeks (  around 04/05/2019) for [place] in person or on telephone.  Homero Fellers MD [facility], [facility] 03/08/2019, 9:50 AM

## 2019-03-08 NOTE — Patient Instructions (Addendum)
First Trimester of Pregnancy The first trimester of pregnancy is from week 1 until the end of week 13 (months 1 through 3). A week after a sperm fertilizes an egg, the egg will implant on the wall of the uterus. This embryo will begin to develop into a baby. Genes from you and your partner will form the baby. The female genes will determine whether the baby will be a boy or a girl. At 6-8 weeks, the eyes and face will be formed, and the heartbeat can be seen on ultrasound. At the end of 12 weeks, all the baby's organs will be formed. Now that you are pregnant, you will want to do everything you can to have a healthy baby. Two of the most important things are to get good prenatal care and to follow your health care provider's instructions. Prenatal care is all the medical care you receive before the baby's birth. This care will help prevent, find, and treat any problems during the pregnancy and childbirth. Body changes during your first trimester Your body goes through many changes during pregnancy. The changes vary from woman to woman.  You may gain or lose a couple of pounds at first.  You may feel sick to your stomach (nauseous) and you may throw up (vomit). If the vomiting is uncontrollable, call your health care provider.  You may tire easily.  You may develop headaches that can be relieved by medicines. All medicines should be approved by your health care provider.  You may urinate more often. Painful urination may mean you have a bladder infection.  You may develop heartburn as a result of your pregnancy.  You may develop constipation because certain hormones are causing the muscles that push stool through your intestines to slow down.  You may develop hemorrhoids or swollen veins (varicose veins).  Your breasts may begin to grow larger and become tender. Your nipples may stick out more, and the tissue that surrounds them (areola) may become darker.  Your gums may bleed and may be  sensitive to brushing and flossing.  Dark spots or blotches (chloasma, mask of pregnancy) may develop on your face. This will likely fade after the baby is born.  Your menstrual periods will stop.  You may have a loss of appetite.  You may develop cravings for certain kinds of food.  You may have changes in your emotions from day to day, such as being excited to be pregnant or being concerned that something may go wrong with the pregnancy and baby.  You may have more vivid and strange dreams.  You may have changes in your hair. These can include thickening of your hair, rapid growth, and changes in texture. Some women also have hair loss during or after pregnancy, or hair that feels dry or thin. Your hair will most likely return to normal after your baby is born. What to expect at prenatal visits During a routine prenatal visit:  You will be weighed to make sure you and the baby are growing normally.  Your blood pressure will be taken.  Your abdomen will be measured to track your baby's growth.  The fetal heartbeat will be listened to between weeks 10 and 14 of your pregnancy.  Test results from any previous visits will be discussed. Your health care provider may ask you:  How you are feeling.  If you are feeling the baby move.  If you have had any abnormal symptoms, such as leaking fluid, bleeding, severe headaches, or abdominal   cramping.  If you are using any tobacco products, including cigarettes, chewing tobacco, and electronic cigarettes.  If you have any questions. Other tests that may be performed during your first trimester include:  Blood tests to find your blood type and to check for the presence of any previous infections. The tests will also be used to check for low iron levels (anemia) and protein on red blood cells (Rh antibodies). Depending on your risk factors, or if you previously had diabetes during pregnancy, you may have tests to check for high blood sugar  that affects pregnant women (gestational diabetes).  Urine tests to check for infections, diabetes, or protein in the urine.  An ultrasound to confirm the proper growth and development of the baby.  Fetal screens for spinal cord problems (spina bifida) and Down syndrome.  HIV (human immunodeficiency virus) testing. Routine prenatal testing includes screening for HIV, unless you choose not to have this test.  You may need other tests to make sure you and the baby are doing well. Follow these instructions at home: Medicines  Follow your health care provider's instructions regarding medicine use. Specific medicines may be either safe or unsafe to take during pregnancy.  Take a prenatal vitamin that contains at least 600 micrograms (mcg) of folic acid.  If you develop constipation, try taking a stool softener if your health care provider approves. Eating and drinking   Eat a balanced diet that includes fresh fruits and vegetables, whole grains, good sources of protein such as meat, eggs, or tofu, and low-fat dairy. Your health care provider will help you determine the amount of weight gain that is right for you.  Avoid raw meat and uncooked cheese. These carry germs that can cause birth defects in the baby.  Eating four or five small meals rather than three large meals a day may help relieve nausea and vomiting. If you start to feel nauseous, eating a few soda crackers can be helpful. Drinking liquids between meals, instead of during meals, also seems to help ease nausea and vomiting.  Limit foods that are high in fat and processed sugars, such as fried and sweet foods.  To prevent constipation: ? Eat foods that are high in fiber, such as fresh fruits and vegetables, whole grains, and beans. ? Drink enough fluid to keep your urine clear or pale yellow. Activity  Exercise only as directed by your health care provider. Most women can continue their usual exercise routine during  pregnancy. Try to exercise for 30 minutes at least 5 days a week. Exercising will help you: ? Control your weight. ? Stay in shape. ? Be prepared for labor and delivery.  Experiencing pain or cramping in the lower abdomen or lower back is a good sign that you should stop exercising. Check with your health care provider before continuing with normal exercises.  Try to avoid standing for long periods of time. Move your legs often if you must stand in one place for a long time.  Avoid heavy lifting.  Wear low-heeled shoes and practice good posture.  You may continue to have sex unless your health care provider tells you not to. Relieving pain and discomfort  Wear a good support bra to relieve breast tenderness.  Take warm sitz baths to soothe any pain or discomfort caused by hemorrhoids. Use hemorrhoid cream if your health care provider approves.  Rest with your legs elevated if you have leg cramps or low back pain.  If you develop varicose veins in   your legs, wear support hose. Elevate your feet for 15 minutes, 3-4 times a day. Limit salt in your diet. Prenatal care  Schedule your prenatal visits by the twelfth week of pregnancy. They are usually scheduled monthly at first, then more often in the last 2 months before delivery.  Write down your questions. Take them to your prenatal visits.  Keep all your prenatal visits as told by your health care provider. This is important. Safety  Wear your seat belt at all times when driving.  Make a list of emergency phone numbers, including numbers for family, friends, the hospital, and police and fire departments. General instructions  Ask your health care provider for a referral to a local prenatal education class. Begin classes no later than the beginning of month 6 of your pregnancy.  Ask for help if you have counseling or nutritional needs during pregnancy. Your health care provider can offer advice or refer you to specialists for help  with various needs.  Do not use hot tubs, steam rooms, or saunas.  Do not douche or use tampons or scented sanitary pads.  Do not cross your legs for long periods of time.  Avoid cat litter boxes and soil used by cats. These carry germs that can cause birth defects in the baby and possibly loss of the fetus by miscarriage or stillbirth.  Avoid all smoking, herbs, alcohol, and medicines not prescribed by your health care provider. Chemicals in these products affect the formation and growth of the baby.  Do not use any products that contain nicotine or tobacco, such as cigarettes and e-cigarettes. If you need help quitting, ask your health care provider. You may receive counseling support and other resources to help you quit.  Schedule a dentist appointment. At home, brush your teeth with a soft toothbrush and be gentle when you floss. Contact a health care provider if:  You have dizziness.  You have mild pelvic cramps, pelvic pressure, or nagging pain in the abdominal area.  You have persistent nausea, vomiting, or diarrhea.  u have a bad smelling vaginal discharge.  You have pain when you urinate.  You notice increased swelling in your face, hands, legs, or ankles.  You are exposed to fifth disease or chickenpox.  You are exposed to [place] measles (rubella) and have never had it. Get help right away if:  You have a fever.  You are leaking fluid from your vagina.  You have spotting or bleeding from your vagina.  You have severe abdominal cramping or pain.  You have rapid weight gain or loss.  You vomit blood or material that looks like coffee grounds.  You develop a severe headache.  You have shortness of breath.  You have any kind of trauma, such as from a fall or a car accident. Summary  The first trimester of pregnancy is from week 1 until the end of week 13 (months 1 through 3).  Your body goes through many changes during pregnancy. The changes vary from  woman to woman.  You will have routine prenatal visits. During those visits, your health care provider will examine you, discuss any test results you may have, and talk with you about how you are feeling. This information is not intended to replace advice given to you by your health care provider. Make sure you discuss any questions you have with your health care provider. Document Released: 04/23/2001 Document Revised: 04/11/2017 Document Reviewed: 04/10/2016 Elsevier Patient Education  2020 [employer].

## 2019-03-15 LAB — MATERNIT21 PLUS CORE+SCA
Fetal Fraction: 5
Monosomy X (Turner Syndrome): NOT DETECTED
Result (T21): NEGATIVE
Trisomy 13 (Patau syndrome): NEGATIVE
Trisomy 18 (Edwards syndrome): NEGATIVE
Trisomy 21 (Down syndrome): NEGATIVE
XXX (Triple X Syndrome): NOT DETECTED
XXY (Klinefelter Syndrome): NOT DETECTED
XYY (Jacobs Syndrome): NOT DETECTED

## 2019-03-17 NOTE — Progress Notes (Signed)
WNL- called, left message, can pick up gender envelope.

## 2019-03-17 NOTE — Progress Notes (Signed)
Please make this patient a gender envelope- Female, thank you

## 2019-03-17 NOTE — Progress Notes (Signed)
Pt has picked up envelope

## 2019-04-05 ENCOUNTER — Encounter: Payer: Self-pay | Admitting: Obstetrics and Gynecology

## 2019-04-05 ENCOUNTER — Ambulatory Visit (INDEPENDENT_AMBULATORY_CARE_PROVIDER_SITE_OTHER): Payer: Managed Care, Other (non HMO) | Admitting: Obstetrics and Gynecology

## 2019-04-05 ENCOUNTER — Other Ambulatory Visit: Payer: Self-pay

## 2019-04-05 VITALS — Wt 206.0 lb

## 2019-04-05 DIAGNOSIS — Z3480 Encounter for supervision of other normal pregnancy, unspecified trimester: Secondary | ICD-10-CM

## 2019-04-05 DIAGNOSIS — O99212 Obesity complicating pregnancy, second trimester: Secondary | ICD-10-CM

## 2019-04-05 DIAGNOSIS — O99210 Obesity complicating pregnancy, unspecified trimester: Secondary | ICD-10-CM

## 2019-04-05 DIAGNOSIS — Z3A16 16 weeks gestation of pregnancy: Secondary | ICD-10-CM

## 2019-04-05 NOTE — Progress Notes (Signed)
Routine Prenatal Care Visit- Virtual Visit  Subjective   Virtual Visit via Telephone Note  I connected with Kristen Hughes on 04/05/19 at  9:10 AM EST by telephone and verified that I am speaking with the correct person using two identifiers.   I discussed the limitations, risks, security and privacy concerns of performing an evaluation and management service by telephone and the availability of in person appointments. I also discussed with the patient that there may be a patient responsible charge related to this service. The patient expressed understanding and agreed to proceed.  The patient was at home.  I spoke with the patient from my Office.  The names of people involved in this encounter were:  Dr. Jerene Pitch and Whitney Post.    Kristen Hughes is a 23 y.o. G2P1001 at [redacted]w[redacted]d being seen today for ongoing prenatal care.  She is currently monitored for the following issues for this low-risk pregnancy and has Supervision of other normal pregnancy, antepartum; Obesity affecting pregnancy, antepartum; and BMI 36.0-36.9,adult on their problem list.  ----------------------------------------------------------------------------------- Patient reports weight loss, but feeling better overall.  Starting a new job Psychiatrist.  Contractions: Not present. Vag. Bleeding: None.  Movement: Absent. Denies leaking of fluid.  ----------------------------------------------------------------------------------- The following portions of the patient's history were reviewed and updated as appropriate: allergies, current medications, past family history, past medical history, past social history, past surgical history and problem list. Problem list updated.   Objective  Weight 206 lb (93.4 kg), last menstrual period 12/05/2018, unknown if currently breastfeeding. Pregravid weight 215 lb (97.5 kg) Total Weight Gain -9 lb (-4.082 kg) Urinalysis:      Fetal Status:     Movement: Absent     Physical  Exam could not be performed. Because of the COVID-19 outbreak this visit was performed over the phone and not in person.   Assessment   23 y.o. G2P1001 at [redacted]w[redacted]d by  09/19/2019, by Ultrasound presenting for routine prenatal visit  Plan   pregnancy Problems (from 02/01/19 to present)    Problem Noted Resolved   BMI 36.0-36.9,adult 02/08/2019 by Conard Novak, MD No   Supervision of other normal pregnancy, antepartum 02/01/2019 by Tresea Mall, CNM No   Overview Addendum 03/08/2019  9:49 AM by Natale Milch, MD    [facility]inic [facility] Prenatal Labs  Dating  8wk [place] Blood type: O/Positive/-- (09/28 1053)   Genetic Screen AFP:        NIPS: Antibody:Negative (09/28 1053)  Anatomic [place]  Rubella: 2.41 (09/28 1053)  Varicella: Immune  GTT Early:               Third trimester:  RPR: Non Reactive (09/28 1053)   Rhogam  not needed HBsAg: Negative (09/28 1053)   Vaccines TDAP:                       Flu Shot: 03/08/2019 HIV: Non Reactive (09/28 1053)   Baby Food  Breast                              GBS:   Contraception  Pap: ASCUS/HPV+ May 2020 with normal colposcopy June 2020  CBB     CS/VBAC NA   Support Person Boyfriend Christiane Ha          Obesity affecting pregnancy, antepartum 02/01/2019 by Tresea Mall, CNM No   Overview Addendum 03/08/2019  9:46 AM by Natale Milch,  MD    [ x] early 1 h gtt          Gestational age appropriate obstetric precautions including but not limited to vaginal bleeding, contractions, leaking of fluid and fetal movement were reviewed in detail with the patient.     Follow Up Instructions: 4 weeks with [place]   I discussed the assessment and treatment plan with the patient. The patient was provided an opportunity to ask questions and all were answered. The patient agreed with the plan and demonstrated an understanding of the instructions.   The patient was advised to call back or seek an in-person evaluation if the symptoms worsen or if the  condition fails to improve as anticipated.  I provided 6 minutes of non-face-to-face time during this encounter.  No follow-ups on file.  Adrian Prows MD [facility] 04/05/2019 10:11 AM

## 2019-04-05 NOTE — Progress Notes (Signed)
ROB telephone C/o Still losing weight Denies lof, no vb

## 2019-04-13 ENCOUNTER — Encounter: Payer: Self-pay | Admitting: Emergency Medicine

## 2019-04-13 ENCOUNTER — Other Ambulatory Visit: Payer: Self-pay

## 2019-04-13 DIAGNOSIS — Y999 Unspecified external cause status: Secondary | ICD-10-CM | POA: Diagnosis not present

## 2019-04-13 DIAGNOSIS — Y93I9 Activity, other involving external motion: Secondary | ICD-10-CM | POA: Insufficient documentation

## 2019-04-13 DIAGNOSIS — Z79899 Other long term (current) drug therapy: Secondary | ICD-10-CM | POA: Diagnosis not present

## 2019-04-13 DIAGNOSIS — Z3A17 17 weeks gestation of pregnancy: Secondary | ICD-10-CM | POA: Insufficient documentation

## 2019-04-13 DIAGNOSIS — O9A212 Injury, poisoning and certain other consequences of external causes complicating pregnancy, second trimester: Secondary | ICD-10-CM | POA: Insufficient documentation

## 2019-04-13 DIAGNOSIS — Y92410 Unspecified street and highway as the place of occurrence of the external cause: Secondary | ICD-10-CM | POA: Insufficient documentation

## 2019-04-13 DIAGNOSIS — R109 Unspecified abdominal pain: Secondary | ICD-10-CM | POA: Insufficient documentation

## 2019-04-13 DIAGNOSIS — Z87891 Personal history of nicotine dependence: Secondary | ICD-10-CM | POA: Diagnosis not present

## 2019-04-13 LAB — CBC WITH DIFFERENTIAL/PLATELET
Abs Immature Granulocytes: 0.06 10*3/uL (ref 0.00–0.07)
Basophils Absolute: 0 10*3/uL (ref 0.0–0.1)
Basophils Relative: 0 %
Eosinophils Absolute: 0.3 10*3/uL (ref 0.0–0.5)
Eosinophils Relative: 2 %
HCT: 38.6 % (ref 36.0–46.0)
Hemoglobin: 13 g/dL (ref 12.0–15.0)
Immature Granulocytes: 0 %
Lymphocytes Relative: 21 %
Lymphs Abs: 3.2 10*3/uL (ref 0.7–4.0)
MCH: 30.9 pg (ref 26.0–34.0)
MCHC: 33.7 g/dL (ref 30.0–36.0)
MCV: 91.7 fL (ref 80.0–100.0)
Monocytes Absolute: 0.8 10*3/uL (ref 0.1–1.0)
Monocytes Relative: 5 %
Neutro Abs: 10.8 10*3/uL — ABNORMAL HIGH (ref 1.7–7.7)
Neutrophils Relative %: 72 %
Platelets: 292 10*3/uL (ref 150–400)
RBC: 4.21 MIL/uL (ref 3.87–5.11)
RDW: 12.3 % (ref 11.5–15.5)
WBC: 15.2 10*3/uL — ABNORMAL HIGH (ref 4.0–10.5)
nRBC: 0 % (ref 0.0–0.2)

## 2019-04-13 LAB — COMPREHENSIVE METABOLIC PANEL
ALT: 36 U/L (ref 0–44)
AST: 22 U/L (ref 15–41)
Albumin: 3.6 g/dL (ref 3.5–5.0)
Alkaline Phosphatase: 61 U/L (ref 38–126)
Anion gap: 13 (ref 5–15)
BUN: 5 mg/dL — ABNORMAL LOW (ref 6–20)
CO2: 21 mmol/L — ABNORMAL LOW (ref 22–32)
Calcium: 9.1 mg/dL (ref 8.9–10.3)
Chloride: 101 mmol/L (ref 98–111)
Creatinine, Ser: 0.49 mg/dL (ref 0.44–1.00)
GFR calc Af Amer: >60 mL/min (ref >60)
GFR calc non Af Amer: >60 mL/min (ref >60)
Glucose, Bld: 76 mg/dL (ref 70–99)
Potassium: 3.4 mmol/L — ABNORMAL LOW (ref 3.5–5.1)
Sodium: 135 mmol/L (ref 135–145)
Total Bilirubin: 0.4 mg/dL (ref 0.3–1.2)
Total Protein: 7.5 g/dL (ref 6.5–8.1)

## 2019-04-13 LAB — TYPE AND SCREEN
ABO/RH(D): O POS
Antibody Screen: NEGATIVE

## 2019-04-13 LAB — URINALYSIS, COMPLETE (UACMP) WITH MICROSCOPIC
Bilirubin Urine: NEGATIVE
Glucose, UA: NEGATIVE mg/dL
Hgb urine dipstick: NEGATIVE
Ketones, ur: 80 mg/dL — AB
Nitrite: NEGATIVE
Protein, ur: NEGATIVE mg/dL
Specific Gravity, Urine: 1.012 (ref 1.005–1.030)
pH: 5 (ref 5.0–8.0)

## 2019-04-13 LAB — HCG, QUANTITATIVE, PREGNANCY: hCG, Beta Chain, Quant, S: 25513 m[IU]/mL — ABNORMAL HIGH (ref <5)
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
## 2019-04-13 NOTE — ED Notes (Signed)
FHTs located to rt lower abd,144 and regular

## 2019-04-14 ENCOUNTER — Emergency Department
Admission: EM | Admit: 2019-04-14 | Discharge: 2019-04-14 | Disposition: A | Discharge status: Home / Self Care | Payer: Managed Care, Other (non HMO) | Attending: Emergency Medicine | Admitting: Emergency Medicine

## 2019-04-14 ENCOUNTER — Emergency Department: Payer: Managed Care, Other (non HMO)

## 2019-04-14 DIAGNOSIS — R109 Unspecified abdominal pain: Secondary | ICD-10-CM

## 2019-04-14 DIAGNOSIS — O9A212 Injury, poisoning and certain other consequences of external causes complicating pregnancy, second trimester: Secondary | ICD-10-CM | POA: Diagnosis not present
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
## 2019-04-14 NOTE — Discharge Instructions (Addendum)
Please seek medical attention for any high fevers, chest pain, shortness of breath, change in behavior, persistent vomiting, bloody stool or any other new or concerning symptoms.  

## 2019-04-15 ENCOUNTER — Ambulatory Visit (INDEPENDENT_AMBULATORY_CARE_PROVIDER_SITE_OTHER): Payer: Managed Care, Other (non HMO) | Admitting: Obstetrics and Gynecology

## 2019-04-15 ENCOUNTER — Encounter: Payer: Self-pay | Admitting: Obstetrics and Gynecology

## 2019-04-15 ENCOUNTER — Other Ambulatory Visit: Payer: Self-pay

## 2019-04-15 VITALS — BP 108/62 | Wt 210.0 lb

## 2019-04-15 DIAGNOSIS — Z3482 Encounter for supervision of other normal pregnancy, second trimester: Secondary | ICD-10-CM

## 2019-04-15 DIAGNOSIS — Z3A17 17 weeks gestation of pregnancy: Secondary | ICD-10-CM

## 2019-04-15 DIAGNOSIS — Z3480 Encounter for supervision of other normal pregnancy, unspecified trimester: Secondary | ICD-10-CM

## 2019-04-15 LAB — URINE CULTURE
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
## 2019-05-03 ENCOUNTER — Ambulatory Visit (INDEPENDENT_AMBULATORY_CARE_PROVIDER_SITE_OTHER): Payer: Managed Care, Other (non HMO)

## 2019-05-03 ENCOUNTER — Ambulatory Visit (INDEPENDENT_AMBULATORY_CARE_PROVIDER_SITE_OTHER): Payer: Managed Care, Other (non HMO) | Admitting: Obstetrics and Gynecology

## 2019-05-03 ENCOUNTER — Other Ambulatory Visit: Payer: Self-pay

## 2019-05-03 ENCOUNTER — Encounter: Payer: Self-pay | Admitting: Obstetrics and Gynecology

## 2019-05-03 VITALS — BP 118/74 | Wt 212.0 lb

## 2019-05-03 DIAGNOSIS — O99210 Obesity complicating pregnancy, unspecified trimester: Secondary | ICD-10-CM

## 2019-05-03 DIAGNOSIS — Z363 Encounter for antenatal screening for malformations: Secondary | ICD-10-CM

## 2019-05-03 DIAGNOSIS — Z3480 Encounter for supervision of other normal pregnancy, unspecified trimester: Secondary | ICD-10-CM

## 2019-05-03 DIAGNOSIS — O99213 Obesity complicating pregnancy, third trimester: Secondary | ICD-10-CM

## 2019-05-03 DIAGNOSIS — Z3A20 20 weeks gestation of pregnancy: Secondary | ICD-10-CM

## 2019-05-03 DIAGNOSIS — Z6836 Body mass index (BMI) 36.0-36.9, adult: Secondary | ICD-10-CM

## 2019-05-03 NOTE — Progress Notes (Signed)
Routine Prenatal Care Visit  Subjective  Kristen Hughes is a 23 y.o. G2P1001 at [redacted]w[redacted]d being seen today for ongoing prenatal care.  She is currently monitored for the following issues for this high-risk pregnancy and has Supervision of other normal pregnancy, antepartum; Obesity affecting pregnancy, antepartum; and BMI 36.0-36.9,adult on their problem list.  ----------------------------------------------------------------------------------- Patient reports no complaints.   Contractions: Not present. Vag. Bleeding: None.  Movement: Present. Leaking Fluid denies.  Anatomy u/s complete today and normal ----------------------------------------------------------------------------------- The following portions of the patient's history were reviewed and updated as appropriate: allergies, current medications, past family history, past medical history, past social history, past surgical history and problem list. Problem list updated.  Objective  Blood pressure 118/74, weight 212 lb (96.2 kg), last menstrual period 12/05/2018, unknown if currently breastfeeding. Pregravid weight 215 lb (97.5 kg) Total Weight Gain -3 lb (-1.361 kg) Urinalysis: Urine Protein    Urine Glucose    Fetal Status: Fetal Heart Rate (bpm): present-normal ([place])   Movement: Present     General:  Alert, oriented and cooperative. Patient is in no acute distress.  Skin: Skin is warm and dry. No rash noted.   Cardiovascular: Normal heart rate noted  Respiratory: Normal respiratory effort, no problems with respiration noted  Abdomen: Soft, gravid, appropriate for gestational age. Pain/Pressure: Absent     Pelvic:  Cervical exam deferred        Extremities: Normal range of motion.  Edema: None  Mental Status: Normal mood and affect. Normal behavior. Normal judgment and thought content.   Imaging Results US OB Comp + 14 Wk  Result Date: 05/03/2019 Patient Name: Khayla Koppenhaver DOB: 07-02-1995 MRN: [id number] ULTRASOUND  REPORT Location: [facility] Date of Service: 05/03/2019 Indications:Anatomy Ultrasound Findings: Mason Jim intrauterine pregnancy is visualized with FHR at 145 BPM. Biometrics give an (U/S) Gestational age of [redacted]w[redacted]d and an (U/S) EDD of 09/12/2019; this correlates with the clinically established Estimated Date of Delivery: 09/19/2019.  Fetal presentation is Variable. EFW: 385 g (14 oz). Placenta: posterior. Grade: 1 AFI: subjectively normal. Anatomic survey is complete and normal; Gender - female.  Right Ovary is normal in appearance. Left Ovary is normal appearance. Survey of the adnexa demonstrates no adnexal masses. There is no free peritoneal fluid in the cul de sac. Impression: 1. [redacted]w[redacted]d Viable Singleton Intrauterine pregnancy by U/S. 2. (U/S) EDD is consistent with Clinically established Estimated Date of Delivery: 09/19/19 . 3. Normal Anatomy Scan Cheryl Flash. Alessi    RDMS There is a singleton gestation with subjectively normal amniotic fluid volume. The fetal biometry correlates with established dating. Detailed evaluation of the fetal anatomy was performed.The fetal anatomical survey appears within normal limits within the resolution of ultrasound as described above.  It must be noted that a normal ultrasound is unable to rule out fetal aneuploidy nor is it able to detect all possible malformations.   The ultrasound images and findings were reviewed by me and I agree with the above report. Thomasene Mohair, MD, [facility], [facility] 05/03/2019 11:45 AM       Assessment   23 y.o. G2P1001 at [redacted]w[redacted]d by  09/19/2019, by Ultrasound presenting for routine prenatal visit  Plan   pregnancy Problems (from 02/01/19 to present)    Problem Noted Resolved   BMI 36.0-36.9,adult 02/08/2019 by Conard Novak, MD No   Supervision of other normal pregnancy, antepartum 02/01/2019 by Tresea Mall, CNM No   Overview Addendum 04/05/2019 10:13 AM by Natale Milch, MD  [facility]  Prenatal Labs  Dating  8wk [place] Blood type: O/Positive/-- (09/28 1053)   Genetic Screen AFP:        NIPS: normal XX Antibody:Negative (09/28 1053)  Anatomic [place]  Rubella: 2.41 (09/28 1053)  Varicella: Immune  GTT Early:               Third trimester:  RPR: Non Reactive (09/28 1053)   Rhogam  not needed HBsAg: Negative (09/28 1053)   Vaccines TDAP:                       Flu Shot: 03/08/2019 HIV: Non Reactive (09/28 1053)   Baby Food  Breast                              GBS:   Contraception  Pap: ASCUS/HPV+ May 2020 with normal colposcopy June 2020  CBB     CS/VBAC NA   Support Person Boyfriend Roderic Palau          Obesity affecting pregnancy, antepartum 02/01/2019 by Rod Can, CNM No   Overview Addendum 03/08/2019  9:46 AM by Homero Fellers, MD    [ x] early 1 h gtt          Preterm labor symptoms and general obstetric precautions including but not limited to vaginal bleeding, contractions, leaking of fluid and fetal movement were reviewed in detail with the patient. Please refer to After Visit Summary for other counseling recommendations.   Return in about 4 weeks (around 05/31/2019) for Routine Prenatal Appointment.  Prentice Docker, MD, [facility], [facility] 05/03/2019 11:51 AM

## 2019-05-31 ENCOUNTER — Other Ambulatory Visit: Payer: Self-pay

## 2019-05-31 ENCOUNTER — Ambulatory Visit (INDEPENDENT_AMBULATORY_CARE_PROVIDER_SITE_OTHER): Payer: Medicaid Other | Admitting: Obstetrics and Gynecology

## 2019-05-31 VITALS — BP 110/60 | Wt 218.0 lb

## 2019-05-31 DIAGNOSIS — Z3A24 24 weeks gestation of pregnancy: Secondary | ICD-10-CM

## 2019-05-31 DIAGNOSIS — Z113 Encounter for screening for infections with a predominantly sexual mode of transmission: Secondary | ICD-10-CM

## 2019-05-31 DIAGNOSIS — O99210 Obesity complicating pregnancy, unspecified trimester: Secondary | ICD-10-CM

## 2019-05-31 DIAGNOSIS — Z3480 Encounter for supervision of other normal pregnancy, unspecified trimester: Secondary | ICD-10-CM

## 2019-05-31 DIAGNOSIS — O99212 Obesity complicating pregnancy, second trimester: Secondary | ICD-10-CM

## 2019-05-31 LAB — POCT URINALYSIS DIPSTICK OB
Glucose, UA: NEGATIVE
POC,PROTEIN,UA: NEGATIVE

## 2019-05-31 NOTE — Progress Notes (Signed)
ROB

## 2019-05-31 NOTE — Progress Notes (Signed)
    Routine Prenatal Care Visit  Subjective  Kristen Hughes is a 24 y.o. G2P1001 at [redacted]w[redacted]d being seen today for ongoing prenatal care.  She is currently monitored for the following issues for this low-risk pregnancy and has Supervision of other normal pregnancy, antepartum; Obesity affecting pregnancy, antepartum; and BMI 36.0-36.9,adult on their problem list.  ----------------------------------------------------------------------------------- Patient reports no complaints.    .  .   . Denies leaking of fluid.  ----------------------------------------------------------------------------------- The following portions of the patient's history were reviewed and updated as appropriate: allergies, current medications, past family history, past medical history, past social history, past surgical history and problem list. Problem list updated.   Objective  Blood pressure 110/60, weight 218 lb (98.9 kg), last menstrual period 12/05/2018, unknown if currently breastfeeding. Pregravid weight 215 lb (97.5 kg) Total Weight Gain 3 lb (1.361 kg) Urinalysis:      Fetal Status:           General:  Alert, oriented and cooperative. Patient is in no acute distress.  Skin: Skin is warm and dry. No rash noted.   Cardiovascular: Normal heart rate noted  Respiratory: Normal respiratory effort, no problems with respiration noted  Abdomen: Soft, gravid, appropriate for gestational age.       Pelvic:  Cervical exam deferred        Extremities: Normal range of motion.     ental Status: Normal mood and affect. Normal behavior. Normal judgment and thought content.     Assessment   24 y.o. G2P1001 at [redacted]w[redacted]d by  09/19/2019, by Ultrasound presenting for routine prenatal visit  Plan   pregnancy Problems (from 02/01/19 to present)    Problem Noted Resolved   BMI 36.0-36.9,adult 02/08/2019 by Conard Novak, MD No   Supervision of other normal pregnancy, antepartum 02/01/2019 by Tresea Mall, CNM No   Overview Addendum 04/05/2019 10:13 AM by Natale Milch, MD    [facility]inic [facility] Prenatal Labs  Dating  8wk [place] Blood type: O/Positive/-- (09/28 1053)   Genetic Screen NIPS: normal XX Antibody:Negative (09/28 1053)  Anatomic [place] Normal Female Rubella: 2.41 (09/28 1053)  Varicella: Immune  GTT Early:124  Third trimester:  RPR: Non Reactive (09/28 1053)   Rhogam  not needed HBsAg: Negative (09/28 1053)   Vaccines TDAP:                       Flu Shot: 03/08/2019 HIV: Non Reactive (09/28 1053)   Baby Food  Breast                              GBS:   Contraception  Pap: ASCUS/HPV+ May 2020 with normal colposcopy June 2020  CBB     CS/VBAC NA   Support Person Boyfriend Christiane Ha          Obesity affecting pregnancy, antepartum 02/01/2019 by Tresea Mall, CNM No   Overview Addendum 03/08/2019  9:46 AM by Natale Milch, MD    [ x] early 1 h gtt          Gestational age appropriate obstetric precautions including but not limited to vaginal bleeding, contractions, leaking of fluid and fetal movement were reviewed in detail with the patient.    No follow-ups on file.  Vena Austria, MD, Ever[facility], [facility] 05/31/2019, 10:28 AM

## 2019-06-28 ENCOUNTER — Ambulatory Visit (INDEPENDENT_AMBULATORY_CARE_PROVIDER_SITE_OTHER): Payer: Medicaid Other | Admitting: Obstetrics & Gynecology

## 2019-06-28 ENCOUNTER — Other Ambulatory Visit: Payer: Medicaid Other

## 2019-06-28 ENCOUNTER — Encounter: Payer: Self-pay | Admitting: Obstetrics & Gynecology

## 2019-06-28 ENCOUNTER — Other Ambulatory Visit: Payer: Self-pay

## 2019-06-28 VITALS — BP 120/80 | Wt 223.0 lb

## 2019-06-28 DIAGNOSIS — Z113 Encounter for screening for infections with a predominantly sexual mode of transmission: Secondary | ICD-10-CM

## 2019-06-28 DIAGNOSIS — Z3480 Encounter for supervision of other normal pregnancy, unspecified trimester: Secondary | ICD-10-CM

## 2019-06-28 DIAGNOSIS — O99210 Obesity complicating pregnancy, unspecified trimester: Secondary | ICD-10-CM

## 2019-06-28 DIAGNOSIS — O99213 Obesity complicating pregnancy, third trimester: Secondary | ICD-10-CM

## 2019-06-28 DIAGNOSIS — Z3A28 28 weeks gestation of pregnancy: Secondary | ICD-10-CM

## 2019-06-28 LAB — POCT URINALYSIS DIPSTICK OB
Glucose, UA: NEGATIVE
POC,PROTEIN,UA: NEGATIVE

## 2019-06-28 NOTE — Addendum Note (Signed)
Addended by: Cornelius Moras D on: 06/28/2019 09:23 AM   Modules accepted: Orders

## 2019-06-28 NOTE — Patient Instructions (Signed)
Third Trimester of Pregnancy The third trimester is from week 28 through week 40 (months 7 through 9). The third trimester is a time when the unborn baby (fetus) is growing rapidly. At the end of the ninth month, the fetus is about 20 inches in length and weighs 6-10 pounds. Body changes during your third trimester Your body will continue to go through many changes during pregnancy. The changes vary from woman to woman. During the third trimester:  Your weight will continue to increase. You can expect to gain 25-35 pounds (11-16 kg) by the end of the pregnancy.  You may begin to get stretch marks on your hips, abdomen, and breasts.  You may urinate more often because the fetus is moving lower into your pelvis and pressing on your bladder.  You may develop or continue to have heartburn. This is caused by increased hormones that slow down muscles in the digestive tract.  You may develop or continue to have constipation because increased hormones slow digestion and cause the muscles that push waste through your intestines to relax.  You may develop hemorrhoids. These are swollen veins (varicose veins) in the rectum that can itch or be painful.  You may develop swollen, bulging veins (varicose veins) in your legs.  You may have increased body aches in the pelvis, back, or thighs. This is due to weight gain and increased hormones that are relaxing your joints.  You may have changes in your hair. These can include thickening of your hair, rapid growth, and changes in texture. Some women also have hair loss during or after pregnancy, or hair that feels dry or thin. Your hair will most likely return to normal after your baby is born.  Your breasts will continue to grow and they will continue to become tender. A yellow fluid (colostrum) may leak from your breasts. This is the first milk you are producing for your baby.  Your belly button may stick out.  You may notice more swelling in your hands,  face, or ankles.  You may have increased tingling or numbness in your hands, arms, and legs. The skin on your belly may also feel numb.  You may feel short of breath because of your expanding uterus.  You may have more problems sleeping. This can be caused by the size of your belly, increased need to urinate, and an increase in your body's metabolism.  You may notice the fetus "dropping," or moving lower in your abdomen (lightening).  You may have increased vaginal discharge.  You may notice your joints feel loose and you may have pain around your pelvic bone. What to expect at prenatal visits You will have prenatal exams every 2 weeks until week 36. Then you will have weekly prenatal exams. During a routine prenatal visit:  You will be weighed to make sure you and the baby are growing normally.  Your blood pressure will be taken.  Your abdomen will be measured to track your baby's growth.  The fetal heartbeat will be listened to.  Any test results from the previous visit will be discussed.  You may have a cervical check near your due date to see if your cervix has softened or thinned (effaced).  You will be tested for Group B streptococcus. This happens between 35 and 37 weeks. Your health care provider may ask you:  What your birth plan is.  How you are feeling.  If you are feeling the baby move.  If you have had any abnormal   symptoms, such as leaking fluid, bleeding, severe headaches, or abdominal cramping.  If you are using any tobacco products, including cigarettes, chewing tobacco, and electronic cigarettes.  If you have any questions. Other tests or screenings that may be performed during your third trimester include:  Blood tests that check for low iron levels (anemia).  Fetal testing to check the health, activity level, and growth of the fetus. Testing is done if you have certain medical conditions or if there are problems during the pregnancy.  Nonstress test  (NST). This test checks the health of your baby to make sure there are no signs of problems, such as the baby not getting enough oxygen. During this test, a belt is placed around your belly. The baby is made to move, and its heart rate is monitored during movement. What is false labor? False labor is a condition in which you feel small, irregular tightenings of the muscles in the womb (contractions) that usually go away with rest, changing position, or drinking water. These are called Braxton Hicks contractions. Contractions may last for hours, days, or even weeks before true labor sets in. If contractions come at regular intervals, become more frequent, increase in intensity, or become painful, you should see your health care provider. What are the signs of labor?  Abdominal cramps.  Regular contractions that start at 10 minutes apart and become stronger and more frequent with time.  Contractions that start on the top of the uterus and spread down to the lower abdomen and back.  Increased pelvic pressure and dull back pain.  A watery or bloody mucus discharge that comes from the vagina.  Leaking of amniotic fluid. This is also known as your "water breaking." It could be a slow trickle or a gush. Let your health care provider know if it has a color or strange odor. If you have any of these signs, call your health care provider right away, even if it is before your due date. Follow these instructions at home: Medicines  Follow your health care provider's instructions regarding medicine use. Specific medicines may be either safe or unsafe to take during pregnancy.  Take a prenatal vitamin that contains at least 600 micrograms (mcg) of folic acid.  If you develop constipation, try taking a stool softener if your health care provider approves. Eating and drinking   Eat a balanced diet that includes fresh fruits and vegetables, whole grains, good sources of protein such as meat, eggs, or tofu,  and low-fat dairy. Your health care provider will help you determine the amount of weight gain that is right for you.  Avoid raw meat and uncooked cheese. These carry germs that can cause birth defects in the baby.  If you have low calcium intake from food, talk to your health care provider about whether you should take a daily calcium supplement.  Eat four or five small meals rather than three large meals a day.  Limit foods that are high in fat and processed sugars, such as fried and sweet foods.  To prevent constipation: ? Drink enough fluid to keep your urine clear or pale yellow. ? Eat foods that are high in fiber, such as fresh fruits and vegetables, whole grains, and beans. Activity  Exercise only as directed by your health care provider. Most women can continue their usual exercise routine during pregnancy. Try to exercise for 30 minutes at least 5 days a week. Stop exercising if you experience uterine contractions.  Avoid heavy lifting.  Do   not exercise in extreme heat or humidity, or at high altitudes.  Wear low-heel, comfortable shoes.  Practice good posture.  You may continue to have sex unless your health care provider tells you otherwise. Relieving pain and discomfort  Take frequent breaks and rest with your legs elevated if you have leg cramps or low back pain.  Take warm sitz baths to soothe any pain or discomfort caused by hemorrhoids. Use hemorrhoid cream if your health care provider approves.  Wear a good support bra to prevent discomfort from breast tenderness.  If you develop varicose veins: ? Wear support pantyhose or compression stockings as told by your healthcare provider. ? Elevate your feet for 15 minutes, 3-4 times a day. Prenatal care  Write down your questions. Take them to your prenatal visits.  Keep all your prenatal visits as told by your health care provider. This is important. Safety  Wear your seat belt at all times when driving.  Make  a list of emergency phone numbers, including numbers for family, friends, the hospital, and police and fire departments. General instructions  Avoid cat litter boxes and soil used by cats. These carry germs that can cause birth defects in the baby. If you have a cat, ask someone to clean the litter box for you.  Do not travel far distances unless it is absolutely necessary and only with the approval of your health care provider.  Do not use hot tubs, steam rooms, or saunas.  Do not drink alcohol.  Do not use any products that contain nicotine or tobacco, such as cigarettes and e-cigarettes. If you need help quitting, ask your health care provider.  Do not use any medicinal herbs or unprescribed drugs. These chemicals affect the formation and growth of the baby.  Do not douche or use tampons or scented sanitary pads.  Do not cross your legs for long periods of time.  To prepare for the arrival of your baby: ? Take prenatal classes to understand, practice, and ask questions about labor and delivery. ? Make a trial run to the hospital. ? Visit the hospital and tour the maternity area. ? Arrange for maternity or paternity leave through employers. ? Arrange for family and friends to take care of pets while you are in the hospital. ? Purchase a rear-facing car seat and make sure you know how to install it in your car. ? Pack your hospital bag. ? Prepare the baby's nursery. Make sure to remove all pillows and stuffed animals from the baby's crib to prevent suffocation.  Visit your dentist if you have not gone during your pregnancy. Use a soft toothbrush to brush your teeth and be gentle when you floss. Contact a health care provider if:  You are unsure if you are in labor or if your water has broken.  You become dizzy.  You have mild pelvic cramps, pelvic pressure, or nagging pain in your abdominal area.  You have lower back pain.  You have persistent nausea, vomiting, or  diarrhea.  You have an unusual or bad smelling vaginal discharge.  You have pain when you urinate. Get help right away if:  Your water breaks before 37 weeks.  You have regular contractions less than 5 minutes apart before 37 weeks.  You have a fever.  You are leaking fluid from your vagina.  You have spotting or bleeding from your vagina.  You have severe abdominal pain or cramping.  You have rapid weight loss or weight gain.  You have   shortness of breath with chest pain.  You notice sudden or extreme swelling of your face, hands, ankles, feet, or legs.  Your baby makes fewer than 10 movements in 2 hours.  You have severe headaches that do not go away when you take medicine.  You have vision changes. Summary  The third trimester is from week 28 through week 40, months 7 through 9. The third trimester is a time when the unborn baby (fetus) is growing rapidly.  During the third trimester, your discomfort may increase as you and your baby continue to gain weight. You may have abdominal, leg, and back pain, sleeping problems, and an increased need to urinate.  During the third trimester your breasts will keep growing and they will continue to become tender. A yellow fluid (colostrum) may leak from your breasts. This is the first milk you are producing for your baby.  False labor is a condition in which you feel small, irregular tightenings of the muscles in the womb (contractions) that eventually go away. These are called Braxton Hicks contractions. Contractions may last for hours, days, or even weeks before true labor sets in.  Signs of labor can include: abdominal cramps; regular contractions that start at 10 minutes apart and become stronger and more frequent with time; watery or bloody mucus discharge that comes from the vagina; increased pelvic pressure and dull back pain; and leaking of amniotic fluid. This information is not intended to replace advice given to you by your  health care provider. Make sure you discuss any questions you have with your health care provider. Document Revised: 08/20/2018 Document Reviewed: 06/04/2016 [employer] Patient Education  2020 [employer]  

## 2019-06-28 NOTE — Progress Notes (Signed)
  Subjective  Fetal Movement? yes Contractions? no Leaking Fluid? no Vaginal Bleeding? no  Objective  BP 120/80   Wt 223 lb (101.2 kg)   LMP 12/05/2018 (Within Days)   BMI 38.28 kg/m  General: NAD Pumonary: no increased work of breathing Abdomen: gravid, non-tender Extremities: no edema Psychiatric: mood appropriate, affect full  Assessment  23 y.o. G2P1001 at [redacted]w[redacted]d by  09/19/2019, by Ultrasound presenting for routine prenatal visit  Plan   Problem List Items Addressed This Visit      Other   Supervision of other normal pregnancy, antepartum    Other Visit Diagnoses    [redacted] weeks gestation of pregnancy    -  Primary      pregnancy Problems (from 02/01/19 to present)    Problem Noted Resolved   BMI 36.0-36.9,adult 02/08/2019 by Conard Novak, MD No   Supervision of other normal pregnancy, antepartum 02/01/2019 by Tresea Mall, CNM No   Overview Addendum 06/28/2019  9:20 AM by Nadara Mustard, MD    [facility] Prenatal Labs  Dating  8wk [place] Blood type: O/Positive/-- (09/28 1053)   Genetic Screen AFP:        NIPS: normal XX Antibody:Negative (09/28 1053)  Anatomic [place] Normal Female Rubella: 2.41 (09/28 1053)  Varicella: Immune  GTT Early:124 Third trimester:  RPR: Non Reactive (09/28 1053)   Rhogam  not needed HBsAg: Negative (09/28 1053)   Vaccines TDAP:                       Flu Shot: 03/08/2019 HIV: Non Reactive (09/28 1053)   Baby Food  Breast                              GBS:   Contraception  Nuvaring after breast feeding; considering POP Pap: ASCUS/HPV+ May 2020 with normal colposcopy June 2020  CBB  No   CS/VBAC NA   Support Person Boyfriend Christiane Ha          Obesity affecting pregnancy, antepartum 02/01/2019 by Tresea Mall, CNM No   Overview Addendum 03/08/2019  9:46 AM by Natale Milch, MD    [ x] early 1 h gtt       Glucola today PNV, [facility]  Annamarie Major, MD, [facility], [facility] 06/28/2019  9:20 AM

## 2019-06-29 ENCOUNTER — Telehealth: Payer: Self-pay

## 2019-06-29 LAB — 28 WEEK RH+PANEL
Basophils Absolute: 0.1 10*3/uL (ref 0.0–0.2)
Basos: 0 %
EOS (ABSOLUTE): 0.2 10*3/uL (ref 0.0–0.4)
Eos: 1 %
Gestational Diabetes Screen: 142 mg/dL — ABNORMAL HIGH (ref 65–139)
HIV Screen 4th Generation wRfx: NONREACTIVE
Hematocrit: 38 % (ref 34.0–46.6)
Hemoglobin: 12.7 g/dL (ref 11.1–15.9)
Immature Grans (Abs): 0.1 10*3/uL (ref 0.0–0.1)
Immature Granulocytes: 1 %
Lymphocytes Absolute: 2.3 10*3/uL (ref 0.7–3.1)
Lymphs: 16 %
MCH: 31.1 pg (ref 26.6–33.0)
MCHC: 33.4 g/dL (ref 31.5–35.7)
MCV: 93 fL (ref 79–97)
Monocytes Absolute: 0.8 10*3/uL (ref 0.1–0.9)
Monocytes: 5 %
Neutrophils Absolute: 11.3 10*3/uL — ABNORMAL HIGH (ref 1.4–7.0)
Neutrophils: 77 %
Platelets: 229 10*3/uL (ref 150–450)
RBC: 4.09 x10E6/uL (ref 3.77–5.28)
RDW: 11.7 % (ref 11.7–15.4)
RPR Ser Ql: NONREACTIVE
WBC: 14.7 10*3/uL — ABNORMAL HIGH (ref 3.4–10.8)

## 2019-06-29 NOTE — Telephone Encounter (Signed)
Pt called for GTT results.  Pt aware GTT was 142. She is aware she needs additional testing. Fasting 3 hour GTT scheduled by Archie Patten.

## 2019-07-01 ENCOUNTER — Other Ambulatory Visit: Payer: Self-pay | Admitting: Obstetrics and Gynecology

## 2019-07-01 ENCOUNTER — Encounter: Payer: Self-pay | Admitting: Obstetrics and Gynecology

## 2019-07-01 ENCOUNTER — Other Ambulatory Visit: Payer: Medicaid Other

## 2019-07-01 DIAGNOSIS — O99810 Abnormal glucose complicating pregnancy: Secondary | ICD-10-CM | POA: Insufficient documentation

## 2019-07-01 DIAGNOSIS — Z3480 Encounter for supervision of other normal pregnancy, unspecified trimester: Secondary | ICD-10-CM

## 2019-07-01 DIAGNOSIS — O99210 Obesity complicating pregnancy, unspecified trimester: Secondary | ICD-10-CM

## 2019-07-01 NOTE — Progress Notes (Signed)
Needs 3-hr OGTT scheduled sometime in the next week

## 2019-07-05 ENCOUNTER — Other Ambulatory Visit: Payer: Medicaid Other

## 2019-07-05 ENCOUNTER — Other Ambulatory Visit: Payer: Self-pay

## 2019-07-05 DIAGNOSIS — O99810 Abnormal glucose complicating pregnancy: Secondary | ICD-10-CM

## 2019-07-05 DIAGNOSIS — Z3480 Encounter for supervision of other normal pregnancy, unspecified trimester: Secondary | ICD-10-CM

## 2019-07-05 DIAGNOSIS — O99210 Obesity complicating pregnancy, unspecified trimester: Secondary | ICD-10-CM

## 2019-07-06 LAB — GESTATIONAL GLUCOSE TOLERANCE
Glucose, Fasting: 79 mg/dL (ref 65–94)
Glucose, GTT - 1 Hour: 163 mg/dL (ref 65–179)
Glucose, GTT - 2 Hour: 162 mg/dL — ABNORMAL HIGH (ref 65–154)
Glucose, GTT - 3 Hour: 110 mg/dL (ref 65–139)

## 2019-07-12 ENCOUNTER — Ambulatory Visit (INDEPENDENT_AMBULATORY_CARE_PROVIDER_SITE_OTHER): Payer: Medicaid Other | Admitting: Obstetrics and Gynecology

## 2019-07-12 ENCOUNTER — Other Ambulatory Visit: Payer: Self-pay

## 2019-07-12 VITALS — BP 102/46 | Wt 222.0 lb

## 2019-07-12 DIAGNOSIS — Z3A30 30 weeks gestation of pregnancy: Secondary | ICD-10-CM

## 2019-07-12 DIAGNOSIS — Z23 Encounter for immunization: Secondary | ICD-10-CM | POA: Diagnosis not present

## 2019-07-12 DIAGNOSIS — O09893 Supervision of other high risk pregnancies, third trimester: Secondary | ICD-10-CM

## 2019-07-12 DIAGNOSIS — O99213 Obesity complicating pregnancy, third trimester: Secondary | ICD-10-CM

## 2019-07-12 DIAGNOSIS — O99810 Abnormal glucose complicating pregnancy: Secondary | ICD-10-CM

## 2019-07-12 DIAGNOSIS — Z3480 Encounter for supervision of other normal pregnancy, unspecified trimester: Secondary | ICD-10-CM

## 2019-07-12 DIAGNOSIS — O99210 Obesity complicating pregnancy, unspecified trimester: Secondary | ICD-10-CM

## 2019-07-12 NOTE — Progress Notes (Signed)
Routine Prenatal Care Visit  Subjective  Kristen Hughes is a 24 y.o. G2P1001 at [redacted]w[redacted]d being seen today for ongoing prenatal care.  She is currently monitored for the following issues for this high-risk pregnancy and has Supervision of other normal pregnancy, antepartum; Obesity affecting pregnancy, antepartum; BMI 36.0-36.9,adult; and Abnormal glucose tolerance affecting pregnancy, antepartum on their problem list.  ----------------------------------------------------------------------------------- Patient reports no complaints.   Contractions: Not present. Vag. Bleeding: None.  Movement: Present. Denies leaking of fluid.  ----------------------------------------------------------------------------------- The following portions of the patient's history were reviewed and updated as appropriate: allergies, current medications, past family history, past medical history, past social history, past surgical history and problem list. Problem list updated.   Objective  Blood pressure (!) 102/46, weight 222 lb (100.7 kg), last menstrual period 12/05/2018, unknown if currently breastfeeding. Pregravid weight 215 lb (97.5 kg) Total Weight Gain 7 lb (3.175 kg)  Body mass index is 38.11 kg/m.  Urinalysis:      Fetal Status: Fetal Heart Rate (bpm): 150 Fundal Height: 30 cm Movement: Present  Presentation: Vertex  General:  Alert, oriented and cooperative. Patient is in no acute distress.  Skin: Skin is warm and dry. No rash noted.   Cardiovascular: Normal heart rate noted  Respiratory: Normal respiratory effort, no problems with respiration noted  Abdomen: Soft, gravid, appropriate for gestational age. Pain/Pressure: Absent     Pelvic:  Cervical exam deferred        Extremities: Normal range of motion.     ental Status: Normal mood and affect. Normal behavior. Normal judgment and thought content.     Assessment   24 y.o. G2P1001 at [redacted]w[redacted]d by  09/19/2019, by Ultrasound presenting for  routine prenatal visit  Plan   pregnancy Problems (from 02/01/19 to present)    Problem Noted Resolved   BMI 36.0-36.9,adult 02/08/2019 by Will Bonnet, MD No   Supervision of other normal pregnancy, antepartum 02/01/2019 by Rod Can, CNM No   Overview Addendum 07/06/2019  1:30 PM by Malachy Mood, MD    [facility] Prenatal Labs  Dating  8wk [place] Blood type: O/Positive/-- (09/28 1053)   Genetic Screen AFP:        NIPS: normal XX Antibody:Negative (09/28 1053)  Anatomic [place] Normal Female Rubella: 2.41 (09/28 1053)  Varicella: Immune  GTT Early:124 Third trimester: 142 3-hr: 79 / 163 / 162 / 110 RPR: Non Reactive (09/28 1053)   Rhogam  not needed HBsAg: Negative (09/28 1053)   Vaccines TDAP:                       Flu Shot: 03/08/2019 HIV: Non Reactive (09/28 1053)   Baby Food  Breast                              GBS:   Contraception  Nuvaring after breast feeding; considering POP Pap: ASCUS/HPV+ May 2020 with normal colposcopy June 2020  CBB  No   CS/VBAC NA   Support Person Boyfriend Roderic Palau          Obesity affecting pregnancy, antepartum 02/01/2019 by Rod Can, CNM No   Overview Addendum 03/08/2019  9:46 AM by Homero Fellers, MD    [ x] early 1 h gtt          Gestational age appropriate obstetric precautions including but not limited to vaginal bleeding, contractions, leaking of fluid and fetal movement were reviewed in  detail with the patient.    TDAP shot today  Return in about 2 weeks (around 07/26/2019) for ROB.  Vena Austria, MD, [facility], [facility] 07/12/2019, 10:20 AM

## 2019-07-12 NOTE — Progress Notes (Signed)
ROB °TDAP today ° °

## 2019-07-26 ENCOUNTER — Other Ambulatory Visit: Payer: Self-pay

## 2019-07-26 ENCOUNTER — Ambulatory Visit (INDEPENDENT_AMBULATORY_CARE_PROVIDER_SITE_OTHER): Payer: Medicaid Other | Admitting: Obstetrics and Gynecology

## 2019-07-26 VITALS — BP 122/66 | Wt 228.0 lb

## 2019-07-26 DIAGNOSIS — Z3A32 32 weeks gestation of pregnancy: Secondary | ICD-10-CM

## 2019-07-26 DIAGNOSIS — Z3483 Encounter for supervision of other normal pregnancy, third trimester: Secondary | ICD-10-CM

## 2019-07-26 DIAGNOSIS — O99213 Obesity complicating pregnancy, third trimester: Secondary | ICD-10-CM

## 2019-07-26 LAB — POCT URINALYSIS DIPSTICK OB
Glucose, UA: NEGATIVE
POC,PROTEIN,UA: NEGATIVE

## 2019-07-26 NOTE — Progress Notes (Signed)
Routine Prenatal Care Visit  Subjective  Kristen Hughes is a 24 y.o. G2P1001 at [redacted]w[redacted]d being seen today for ongoing prenatal care.  She is currently monitored for the following issues for this low-risk pregnancy and has Supervision of other normal pregnancy, antepartum; Obesity affecting pregnancy, antepartum; BMI 36.0-36.9,adult; and Abnormal glucose tolerance affecting pregnancy, antepartum on their problem list.  ----------------------------------------------------------------------------------- Patient reports no complaints.   Contractions: Not present. Vag. Bleeding: None.  Movement: Present. Denies leaking of fluid.  ----------------------------------------------------------------------------------- The following portions of the patient's history were reviewed and updated as appropriate: allergies, current medications, past family history, past medical history, past social history, past surgical history and problem list. Problem list updated.   Objective  Blood pressure 122/66, weight 228 lb (103.4 kg), last menstrual period 12/05/2018, unknown if currently breastfeeding. Pregravid weight 215 lb (97.5 kg) Total Weight Gain 13 lb (5.897 kg)  Body mass index is 39.14 kg/m.  Urinalysis:      Fetal Status: Fetal Heart Rate (bpm): 135 Fundal Height: 30 cm Movement: Present  Presentation: Complete Breech  General:  Alert, oriented and cooperative. Patient is in no acute distress.  Skin: Skin is warm and dry. No rash noted.   Cardiovascular: Normal heart rate noted  Respiratory: Normal respiratory effort, no problems with respiration noted  Abdomen: Soft, gravid, appropriate for gestational age. Pain/Pressure: Absent     Pelvic:  Cervical exam deferred        Extremities: Normal range of motion.     ental Status: Normal mood and affect. Normal behavior. Normal judgment and thought content.     Assessment   24 y.o. G2P1001 at [redacted]w[redacted]d by  09/19/2019, by Ultrasound presenting for  routine prenatal visit  Plan   pregnancy Problems (from 02/01/19 to present)    Problem Noted Resolved   BMI 36.0-36.9,adult 02/08/2019 by Conard Novak, MD No   Supervision of other normal pregnancy, antepartum 02/01/2019 by Tresea Mall, CNM No   Overview Addendum 07/12/2019 10:20 AM by Vena Austria, MD    [facility] Prenatal Labs  Dating  8wk [place] Blood type: O/Positive/-- (09/28 1053)   Genetic Screen AFP:        NIPS: normal XX Antibody:Negative (09/28 1053)  Anatomic [place] Normal Female Rubella: 2.41 (09/28 1053)  Varicella: Immune  GTT Early:124 Third trimester: 142 3-hr: 79 / 163 / 162 / 110 RPR: Non Reactive (09/28 1053)   Rhogam  not needed HBsAg: Negative (09/28 1053)   Vaccines TDAP: 07/12/2019                      Flu Shot: 03/08/2019 HIV: Non Reactive (09/28 1053)   Baby Food  Breast                              GBS:   Contraception  Nuvaring after breast feeding; considering POP Pap: ASCUS/HPV+ May 2020 with normal colposcopy June 2020  CBB  No   CS/VBAC NA   Support Person Boyfriend Christiane Ha          Obesity affecting pregnancy, antepartum 02/01/2019 by Tresea Mall, CNM No   Overview Addendum 03/08/2019  9:46 AM by Natale Milch, MD    [ x] early 1 h gtt          Gestational age appropriate obstetric precautions including but not limited to vaginal bleeding, contractions, leaking of fluid and fetal movement were reviewed in detail  with the patient.    Return in about 2 weeks (around 08/09/2019) for ROB.  Malachy Mood, MD, [facility], [facility] 07/26/2019, 10:43 AM

## 2019-07-26 NOTE — Progress Notes (Signed)
ROB Left hand/foot swelling

## 2019-08-09 ENCOUNTER — Ambulatory Visit (INDEPENDENT_AMBULATORY_CARE_PROVIDER_SITE_OTHER): Payer: Medicaid Other | Admitting: Obstetrics and Gynecology

## 2019-08-09 ENCOUNTER — Other Ambulatory Visit: Payer: Self-pay

## 2019-08-09 VITALS — BP 122/68 | Wt 230.0 lb

## 2019-08-09 DIAGNOSIS — O99210 Obesity complicating pregnancy, unspecified trimester: Secondary | ICD-10-CM

## 2019-08-09 DIAGNOSIS — O99810 Abnormal glucose complicating pregnancy: Secondary | ICD-10-CM

## 2019-08-09 DIAGNOSIS — Z3A34 34 weeks gestation of pregnancy: Secondary | ICD-10-CM

## 2019-08-09 DIAGNOSIS — Z3480 Encounter for supervision of other normal pregnancy, unspecified trimester: Secondary | ICD-10-CM

## 2019-08-09 DIAGNOSIS — O99213 Obesity complicating pregnancy, third trimester: Secondary | ICD-10-CM

## 2019-08-09 LAB — POCT URINALYSIS DIPSTICK OB
Glucose, UA: NEGATIVE
POC,PROTEIN,UA: NEGATIVE

## 2019-08-09 NOTE — Progress Notes (Signed)
ROB

## 2019-08-09 NOTE — Progress Notes (Signed)
Routine Prenatal Care Visit  Subjective  Kristen Hughes is a 24 y.o. G2P1001 at [redacted]w[redacted]d being seen today for ongoing prenatal care.  She is currently monitored for the following issues for this high-risk pregnancy and has Supervision of other normal pregnancy, antepartum; Obesity affecting pregnancy, antepartum; BMI 36.0-36.9,adult; and Abnormal glucose tolerance affecting pregnancy, antepartum on their problem list.  ----------------------------------------------------------------------------------- Patient reports worsening dizzyness at work exacerbated by heat..   Contractions: Not present. Vag. Bleeding: None.  Movement: Present. Denies leaking of fluid.  ----------------------------------------------------------------------------------- The following portions of the patient's history were reviewed and updated as appropriate: allergies, current medications, past family history, past medical history, past social history, past surgical history and problem list. Problem list updated.   Objective  Blood pressure 122/68, weight 230 lb (104.3 kg), last menstrual period 12/05/2018, unknown if currently breastfeeding. Pregravid weight 215 lb (97.5 kg) Total Weight Gain 15 lb (6.804 kg)  Body mass index is 39.48 kg/m.  Urinalysis:      Fetal Status: Fetal Heart Rate (bpm): 145 Fundal Height: 33 cm Movement: Present  Presentation: Vertex  General:  Alert, oriented and cooperative. Patient is in no acute distress.  Skin: Skin is warm and dry. No rash noted.   Cardiovascular: Normal heart rate noted  Respiratory: Normal respiratory effort, no problems with respiration noted  Abdomen: Soft, gravid, appropriate for gestational age. Pain/Pressure: Absent     Pelvic:  Cervical exam deferred        Extremities: Normal range of motion.     ental Status: Normal mood and affect. Normal behavior. Normal judgment and thought content.     Assessment   24 y.o. G2P1001 at [redacted]w[redacted]d by  09/19/2019, by  Ultrasound presenting for routine prenatal visit  Plan   pregnancy Problems (from 02/01/19 to present)    Problem Noted Resolved   BMI 36.0-36.9,adult 02/08/2019 by Will Bonnet, MD No   Supervision of other normal pregnancy, antepartum 02/01/2019 by Rod Can, CNM No   Overview Addendum 07/12/2019 10:20 AM by Malachy Mood, MD    [facility] Prenatal Labs  Dating  8wk [place] Blood type: O/Positive/-- (09/28 1053)   Genetic Screen AFP:        NIPS: normal XX Antibody:Negative (09/28 1053)  Anatomic [place] Normal Female Rubella: 2.41 (09/28 1053)  Varicella: Immune  GTT Early:124 Third trimester: 142 3-hr: 79 / 163 / 162 / 110 RPR: Non Reactive (09/28 1053)   Rhogam  not needed HBsAg: Negative (09/28 1053)   Vaccines TDAP: 07/12/2019                      Flu Shot: 03/08/2019 HIV: Non Reactive (09/28 1053)   Baby Food  Breast                              GBS:   Contraception  Nuvaring after breast feeding; considering POP Pap: ASCUS/HPV+ May 2020 with normal colposcopy June 2020  CBB  No   CS/VBAC NA   Support Person Boyfriend Roderic Palau          Obesity affecting pregnancy, antepartum 02/01/2019 by Rod Can, CNM No   Overview Addendum 03/08/2019  9:46 AM by Homero Fellers, MD    [ x] early 1 h gtt          Gestational age appropriate obstetric precautions including but not limited to vaginal bleeding, contractions, leaking of fluid and fetal movement  were reviewed in detail with the patient.    1) Caval compression symptoms - discussed supportive measures.  Out of work given aggrevated by standing in hot kitchen.    Return in about 2 weeks (around 08/23/2019) for ROB and growth.  Vena Austria, MD, [facility], [facility] 08/09/2019, 10:38 AM

## 2019-08-24 ENCOUNTER — Encounter: Payer: Self-pay | Admitting: Obstetrics & Gynecology

## 2019-08-24 ENCOUNTER — Other Ambulatory Visit (HOSPITAL_COMMUNITY)
Admission: RE | Admit: 2019-08-24 | Discharge: 2019-08-24 | Disposition: A | Discharge status: Home / Self Care | Payer: Medicaid Other | Source: Ambulatory Visit | Attending: Obstetrics & Gynecology | Admitting: Obstetrics & Gynecology

## 2019-08-24 ENCOUNTER — Other Ambulatory Visit: Payer: Self-pay

## 2019-08-24 ENCOUNTER — Ambulatory Visit (INDEPENDENT_AMBULATORY_CARE_PROVIDER_SITE_OTHER): Payer: Medicaid Other | Admitting: Obstetrics & Gynecology

## 2019-08-24 ENCOUNTER — Ambulatory Visit (INDEPENDENT_AMBULATORY_CARE_PROVIDER_SITE_OTHER): Payer: Medicaid Other

## 2019-08-24 VITALS — BP 120/80 | Wt 237.0 lb

## 2019-08-24 DIAGNOSIS — O99810 Abnormal glucose complicating pregnancy: Secondary | ICD-10-CM | POA: Diagnosis not present

## 2019-08-24 DIAGNOSIS — Z362 Encounter for other antenatal screening follow-up: Secondary | ICD-10-CM

## 2019-08-24 DIAGNOSIS — Z3A36 36 weeks gestation of pregnancy: Secondary | ICD-10-CM

## 2019-08-24 DIAGNOSIS — O99213 Obesity complicating pregnancy, third trimester: Secondary | ICD-10-CM | POA: Diagnosis not present

## 2019-08-24 DIAGNOSIS — Z3480 Encounter for supervision of other normal pregnancy, unspecified trimester: Secondary | ICD-10-CM

## 2019-08-24 DIAGNOSIS — O99210 Obesity complicating pregnancy, unspecified trimester: Secondary | ICD-10-CM

## 2019-08-24 DIAGNOSIS — Z113 Encounter for screening for infections with a predominantly sexual mode of transmission: Secondary | ICD-10-CM | POA: Diagnosis present

## 2019-08-24 DIAGNOSIS — Z3483 Encounter for supervision of other normal pregnancy, third trimester: Secondary | ICD-10-CM

## 2019-08-24 DIAGNOSIS — Z3685 Encounter for antenatal screening for Streptococcus B: Secondary | ICD-10-CM

## 2019-08-24 LAB — POCT URINALYSIS DIPSTICK OB
Glucose, UA: NEGATIVE
POC,PROTEIN,UA: NEGATIVE

## 2019-08-24 NOTE — Progress Notes (Signed)
  Subjective  Fetal Movement? yes Contractions? Yes- occas BHs Leaking Fluid? no Vaginal Bleeding? no  Objective  BP 120/80   Wt 237 lb (107.5 kg)   LMP 12/05/2018 (Within Days)   BMI 40.68 kg/m  General: NAD Pumonary: no increased work of breathing Abdomen: gravid, non-tender Extremities: no edema Psychiatric: mood appropriate, affect full SVE 3/80/-3, Vtx Assessment  24 y.o. G2P1001 at [redacted]w[redacted]d by  09/19/2019, by Ultrasound presenting for routine prenatal visit  Plan   Problem List Items Addressed This Visit      Other   Supervision of other normal pregnancy, antepartum   Obesity affecting pregnancy, antepartum    Other Visit Diagnoses    [redacted] weeks gestation of pregnancy    -  Primary   Encounter for antenatal screening for Streptococcus B       Relevant Orders   Culture, beta strep (group b only)   Screen for STD (sexually transmitted disease)       Relevant Orders   Cervicovaginal ancillary only    Review of ULTRASOUND.    I have personally reviewed images and report of recent ultrasound done at [facility].    Plan of management to be discussed with patient.    Growth 75%, EFW 7-3, reassured    AFI 15    Vtx  pregnancy Problems (from 02/01/19 to present)    Problem Noted Resolved   BMI 36.0-36.9,adult 02/08/2019 by Conard Novak, MD No   Supervision of other normal pregnancy, antepartum 02/01/2019 by Tresea Mall, CNM No   Overview Addendum 07/12/2019 10:20 AM by Vena Austria, MD    [facility] Prenatal Labs  Dating  8wk [place] Blood type: O/Positive/-- (09/28 1053)   Genetic Screen AFP:        NIPS: normal XX Antibody:Negative (09/28 1053)  Anatomic [place] Normal Female Rubella: 2.41 (09/28 1053)  Varicella: Immune  GTT Early:124 Third trimester: 142 3-hr: 79 / 163 / 162 / 110 RPR: Non Reactive (09/28 1053)   Rhogam  not needed HBsAg: Negative (09/28 1053)   Vaccines TDAP: 07/12/2019                      Flu Shot: 03/08/2019 HIV: Non Reactive (09/28 1053)    Baby Food  Breast                              GBS:   Contraception  Nuvaring after breast feeding; considering POP Pap: ASCUS/HPV+ May 2020 with normal colposcopy June 2020  CBB  No   CS/VBAC NA   Support Person Boyfriend Christiane Ha          Obesity affecting pregnancy, antepartum 02/01/2019 by Tresea Mall, CNM No   Overview Addendum 03/08/2019  9:46 AM by Natale Milch, MD    [ x] early 1 h gtt       Labor precautions, PNV, FMC GBS and Aptima today   Annamarie Major, MD, [facility], [facility] 08/24/2019  10:12 AM

## 2019-08-24 NOTE — Addendum Note (Signed)
Addended by: Cornelius Moras D on: 08/24/2019 10:32 AM   Modules accepted: Orders

## 2019-08-24 NOTE — Patient Instructions (Signed)
Group B Streptococcus Infection During Pregnancy °Group B Streptococcus (GBS) is a type of bacteria that is often found in healthy people. It is commonly found in the rectum, vagina, and intestines. In people who are healthy and not pregnant, the bacteria rarely cause serious illness or complications. However, women who test positive for GBS during pregnancy can pass the bacteria to the baby during childbirth. This can cause serious infection in the baby after birth. °Women with GBS may also have infections during their pregnancy or soon after childbirth. The infections include urinary tract infections (UTIs) or infections of the uterus. GBS also increases a woman's risk of complications during pregnancy, such as early labor or delivery, miscarriage, or stillbirth. Routine testing for GBS is recommended for all pregnant women. °What are the causes? °This condition is caused by bacteria called Streptococcus agalactiae. °What increases the risk? °You may have a higher risk for GBS infection during pregnancy if you had one during a past pregnancy. °What are the signs or symptoms? °In most cases, GBS infection does not cause symptoms in pregnant women. If symptoms exist, they may include: °· Labor that starts before the 37th week of pregnancy. °· A UTI or bladder infection. This may cause a fever, frequent urination, or pain and burning during urination. °· Fever during labor. There can also be a rapid heartbeat in the mother or baby. °Rare but serious symptoms of a GBS infection in women include: °· Blood infection (septicemia). This may cause fever, chills, or confusion. °· Lung infection (pneumonia). This may cause fever, chills, cough, rapid breathing, chest pain, or difficulty breathing. °· Bone, joint, skin, or soft tissue infection. °How is this diagnosed? °You may be screened for GBS between week 35 and week 37 of pregnancy. If you have symptoms of preterm labor, you may be screened earlier. This condition is  diagnosed based on lab test results from: °· A swab of fluid from the vagina and rectum. °· A urine sample. °How is this treated? °This condition is treated with antibiotic medicine. Antibiotic medicine may be given: °· To you when you go into labor, or as soon as your water breaks. The medicines will continue until after you give birth. If you are having a cesarean delivery, you do not need antibiotics unless your water has broken. °· To your baby, if he or she requires treatment. Your health care provider will check your baby to decide if he or she needs antibiotics to prevent a serious infection. °Follow these instructions at home: °· Take over-the-counter and prescription medicines only as told by your health care provider. °· Take your antibiotic medicine as told by your health care provider. Do not stop taking the antibiotic even if you start to feel better. °· Keep all pre-birth (prenatal) visits and follow-up visits as told by your health care provider. This is important. °Contact a health care provider if: °· You have pain or burning when you urinate. °· You have to urinate more often than usual. °· You have a fever or chills. °· You develop a bad-smelling vaginal discharge. °Get help right away if: °· Your water breaks. °· You go into labor. °· You have severe pain in your abdomen. °· You have difficulty breathing. °· You have chest pain. °These symptoms may represent a serious problem that is an emergency. Do not wait to see if the symptoms will go away. Get medical help right away. Call your local emergency services (911 in the [place] Do not drive yourself to   the hospital. °Summary °· GBS is a type of bacteria that is common in healthy people. °· During pregnancy, colonization with GBS can cause serious complications for you or your baby. °· Your health care provider will screen you between 35 and 37 weeks of pregnancy to determine if you are colonized with GBS. °· If you are colonized with GBS during  pregnancy, your health care provider will recommend antibiotics through an IV during labor. °· After delivery, your baby will be evaluated for complications related to potential GBS infection and may require antibiotics to prevent a serious infection. °This information is not intended to replace advice given to you by your health care provider. Make sure you discuss any questions you have with your health care provider. °Document Revised: 11/23/2018 Document Reviewed: 11/23/2018 °Elsevier Patient Education © 2020 [employer] Inc[employer] ° °

## 2019-08-26 LAB — CERVICOVAGINAL ANCILLARY ONLY
Chlamydia: NEGATIVE
Comment: NEGATIVE
Comment: NORMAL
Neisseria Gonorrhea: NEGATIVE

## 2019-08-28 LAB — CULTURE, BETA STREP (GROUP B ONLY): Strep Gp B Culture: NEGATIVE

## 2019-08-30 MED ORDER — PROMETHAZINE HCL 25 MG/ML IJ SOLN
25.00 | INTRAMUSCULAR | Status: DC
Start: ? — End: 2019-08-30

## 2019-08-30 MED ORDER — MUCUS & COUGH RELIEF CHILDRENS 5-100 MG/5ML PO LIQD
0.50 | ORAL | Status: DC
Start: ? — End: 2019-08-30

## 2019-08-30 MED ORDER — NALOXONE HCL 0.4 MG/ML IJ SOLN
0.40 | INTRAMUSCULAR | Status: DC
Start: ? — End: 2019-08-30

## 2019-08-30 MED ORDER — NEOMYCIN-POLYMYXIN-HC EX
250.00 | CUTANEOUS | Status: DC
Start: ? — End: 2019-08-30

## 2019-08-30 MED ORDER — GENERIC EXTERNAL MEDICATION
Status: DC
Start: ? — End: 2019-08-30

## 2019-08-30 MED ORDER — Medication
1.00 | Status: DC
Start: ? — End: 2019-08-30

## 2019-08-30 MED ORDER — IBUPROFEN 600 MG PO TABS
600.00 | ORAL_TABLET | ORAL | Status: DC
Start: 2019-08-29 — End: 2019-08-30

## 2019-08-30 MED ORDER — WITCH HAZEL-GLYCERIN EX PADS
1.00 | MEDICATED_PAD | CUTANEOUS | Status: DC
Start: ? — End: 2019-08-30

## 2019-08-30 MED ORDER — GENERIC EXTERNAL MEDICATION
1.00 | Status: DC
Start: ? — End: 2019-08-30

## 2019-08-30 MED ORDER — Medication
Status: DC
Start: ? — End: 2019-08-30

## 2019-08-30 MED ORDER — TRANEXAMIC ACID-NACL 1000-0.7 MG/100ML-% IV SOLN
1000.00 | INTRAVENOUS | Status: DC
Start: ? — End: 2019-08-30

## 2019-08-30 MED ORDER — DSS 100 MG PO CAPS
100.00 | ORAL_CAPSULE | ORAL | Status: DC
Start: ? — End: 2019-08-30

## 2019-08-30 MED ORDER — BARIJODEEL PO
10.00 | ORAL | Status: DC
Start: 2019-08-29 — End: 2019-08-30

## 2019-08-30 MED ORDER — TETANUS-DIPHTH-ACELL PERTUSSIS 5-2.5-18.5 LF-MCG/0.5 IM SUSP
0.50 | INTRAMUSCULAR | Status: DC
Start: ? — End: 2019-08-30

## 2019-08-30 MED ORDER — OXYTOCIN-SODIUM CHLORIDE 30-0.9 UT/500ML-% IV SOLN
18.00 | INTRAVENOUS | Status: DC
Start: ? — End: 2019-08-30

## 2019-08-30 MED ORDER — PHENYLEPHRINE-GUAIFENESIN 20-375 MG PO CP12
10.00 | ORAL_CAPSULE | ORAL | Status: DC
Start: ? — End: 2019-08-30

## 2019-08-30 MED ORDER — CALCIUM CARBONATE 1250 (500 CA) MG PO CHEW
1000.00 | CHEWABLE_TABLET | ORAL | Status: DC
Start: ? — End: 2019-08-30

## 2019-08-30 MED ORDER — PEDI-PRE TAPE SPRAY EX LIQD
0.20 | CUTANEOUS | Status: DC
Start: ? — End: 2019-08-30

## 2019-08-30 MED ORDER — ACETAMINOPHEN 325 MG PO TABS
650.00 | ORAL_TABLET | ORAL | Status: DC
Start: ? — End: 2019-08-30

## 2019-08-30 MED ORDER — PILOCAR 4 % OP SOLN
0.50 | OPHTHALMIC | Status: DC
Start: ? — End: 2019-08-30

## 2019-08-30 MED ORDER — ONDANSETRON HCL 4 MG/2ML IJ SOLN
4.00 | INTRAMUSCULAR | Status: DC
Start: ? — End: 2019-08-30

## 2019-08-30 MED ORDER — LIDOCAINE HCL 1 % IJ SOLN
30.00 | INTRAMUSCULAR | Status: DC
Start: ? — End: 2019-08-30

## 2019-08-30 MED ORDER — PHENYLEPHRINE-MINERAL OIL-PET 0.25-14-74.9 % RE OINT
1.00 | TOPICAL_OINTMENT | RECTAL | Status: DC
Start: ? — End: 2019-08-30

## 2019-08-30 MED ORDER — MAGNESIUM HYDROXIDE 2400 MG/10ML PO SUSP
10.00 | ORAL | Status: DC
Start: ? — End: 2019-08-30

## 2019-08-30 MED ORDER — RICOLA HERB MT
80.00 | OROMUCOSAL | Status: DC
Start: ? — End: 2019-08-30

## 2019-09-01 ENCOUNTER — Encounter: Payer: Medicaid Other | Admitting: Obstetrics and Gynecology

## 2019-10-13 ENCOUNTER — Encounter: Payer: Self-pay | Admitting: Obstetrics and Gynecology

## 2019-10-13 ENCOUNTER — Ambulatory Visit (INDEPENDENT_AMBULATORY_CARE_PROVIDER_SITE_OTHER): Payer: Medicaid Other | Admitting: Obstetrics and Gynecology

## 2019-10-13 ENCOUNTER — Other Ambulatory Visit: Payer: Self-pay

## 2019-10-13 ENCOUNTER — Other Ambulatory Visit (HOSPITAL_COMMUNITY)
Admission: RE | Admit: 2019-10-13 | Discharge: 2019-10-13 | Disposition: A | Discharge status: Home / Self Care | Payer: Medicaid Other | Source: Ambulatory Visit | Attending: Obstetrics and Gynecology | Admitting: Obstetrics and Gynecology

## 2019-10-13 VITALS — BP 115/72 | Ht 64.0 in | Wt 219.6 lb

## 2019-10-13 DIAGNOSIS — R87610 Atypical squamous cells of undetermined significance on cytologic smear of cervix (ASC-US): Secondary | ICD-10-CM | POA: Diagnosis present

## 2019-10-13 DIAGNOSIS — Z30015 Encounter for initial prescription of vaginal ring hormonal contraceptive: Secondary | ICD-10-CM

## 2019-10-13 DIAGNOSIS — Z124 Encounter for screening for malignant neoplasm of cervix: Secondary | ICD-10-CM | POA: Diagnosis present

## 2019-10-13 MED ORDER — ETONOGESTREL-ETHINYL ESTRADIOL 0.12-0.015 MG/24HR VA RING: VAGINAL_RING | VAGINAL | 11 refills | Status: DC

## 2019-10-13 NOTE — Progress Notes (Signed)
   OBSTETRICS POSTPARTUM CLINIC PROGRESS NOTE  Subjective:     Kristen Hughes is a 24 y.o. [phone] female who presents for a postpartum visit. She delivered on 08/28/2019 at an outside institution   She is 6 weeks postpartum following a Preterm pregnancy <37 weeks and delivery by Vaginal, no problems at delivery.  I have fully reviewed the prenatal and intrapartum course. Anesthesia: none.  Postpartum course has been complicated by uncomplicated.  Baby is feeding by Bottle.  Bleeding: patient has not  resumed menses.  Bowel function is normal. Bladder function is normal.  Patient is sexually active. Contraception method desired is NuvaRing vaginal inserts.  Postpartum depression screening: negative. [place] 0.  The following portions of the patient's history were reviewed and updated as appropriate: allergies, current medications, past family history, past medical history, past social history, past surgical history and problem list.  Review of Systems Pertinent items are noted in HPI.  Objective:    BP 115/72   Ht 5\' 4"  (1.626 m)   Wt 219 lb 9.6 oz (99.6 kg)   Breastfeeding No   BMI 37.69 kg/m   General:  alert and no distress   Breasts:  inspection negative, no nipple discharge or bleeding, no masses or nodularity palpable  Lungs: clear to auscultation bilaterally  Heart:  regular rate and rhythm, S1, S2 normal, no murmur, click, rub or gallop  Abdomen: soft, non-tender; bowel sounds normal; no masses,  no organomegaly.    Vulva:  normal  Vagina: normal vagina, no discharge, exudate, lesion, or erythema  Cervix:  no cervical motion tenderness and no lesions  Corpus: normal size, contour, position, consistency, mobility, non-tender  Adnexa:  normal adnexa and no mass, fullness, tenderness  Rectal Exam: Not performed.          Assessment:  Post Partum Care visit There are no diagnoses linked to this encounter.  Plan:  See orders and Patient Instructions Follow up  in: 12 months or as needed.   Pap smear completed today, hx of ASCUS pap RX for Nuvaring sent. Reviewed quick start method of contraception initiation.  MD, [facility], [facility] 10/13/2019 1:46 PM

## 2019-10-13 NOTE — Patient Instructions (Signed)
Ethinyl Estradiol; Etonogestrel vaginal ring What is this medicine? ETHINYL ESTRADIOL; ETONOGESTREL (ETH in il es tra DYE ole; et oh noe JES trel) vaginal ring is a flexible, vaginal ring used as a contraceptive (birth control method). This medicine combines 2 types of female hormones, an estrogen and a progestin. This ring is used to prevent ovulation and pregnancy. Each ring is effective for 1 month. This medicine may be used for other purposes; ask your health care provider or pharmacist if you have questions. COMMON BRAND NAME(S): EluRyng, NuvaRing What should I tell my health care provider before I take this medicine? They need to know if you have any of these conditions:  abnormal vaginal bleeding  blood vessel disease or blood clots  breast, cervical, endometrial, ovarian, liver, or uterine cancer  diabetes  gallbladder disease  having surgery  heart disease or recent heart attack  high blood pressure  high cholesterol or triglycerides  history of irregular heartbeat or heart valve problems  kidney disease  liver disease  migraine headaches  protein C deficiency  protein S deficiency  recently had a baby, miscarriage, or abortion  stroke  systemic lupus erythematosus (SLE)  tobacco smoker  your age is more than 26 years old  an unusual or allergic reaction to estrogens, progestins, other medicines, foods, dyes, or preservatives  pregnant or trying to get pregnant  breast-feeding How should I use this medicine? Insert the ring into your vagina as directed. Follow the directions on the prescription label. The ring will remain place for 3 weeks and is then removed for a 1-week break. A new ring is inserted 1 week after the last ring was removed, on the same day of the week. Check often to make sure the ring is still in place. If the ring was out of the vagina for an unknown amount of time, you may not be protected from pregnancy. Perform a pregnancy test and  call your doctor. Do not use more often than directed. A patient package insert for the product will be given with each prescription and refill. Read this sheet carefully each time. The sheet may change frequently. Contact your pediatrician regarding the use of this medicine in children. Special care may be needed. Overdosage: If you think you have taken too much of this medicine contact a poison control center or emergency room at once. NOTE: This medicine is only for you. Do not share this medicine with others. What if I miss a dose? You will need to use the ring exactly as directed. It is very important to follow the schedule every cycle. If you do not use the ring as directed, you may not be protected from pregnancy. If the ring should slip out, is lost, or if you leave it in longer or shorter than you should, contact your health care professional for advice. What may interact with this medicine? Do not take this medicine with the following medications:  dasabuvir; ombitasvir; paritaprevir; ritonavir  ombitasvir; paritaprevir; ritonavir  vaginal lubricants or other vaginal products that are oil-based or silicone-based This medicine may also interact with the following medications:  acetaminophen  antibiotics or medicines for infections, especially rifampin, rifabutin, rifapentine, and griseofulvin, and possibly penicillins or tetracyclines  aprepitant or fosaprepitant  armodafinil  ascorbic acid (vitamin C)  barbiturate medicines, such as phenobarbital or primidone  bosentan  certain antiviral medicines for hepatitis, HIV or AIDS  certain medicines for cancer treatment  certain medicines for seizures like carbamazepine, clobazam, felbamate, lamotrigine, oxcarbazepine, phenytoin,   rufinamide, topiramate  certain medicines for treating high cholesterol  cyclosporine  dantrolene  elagolix  flibanserin  grapefruit juice  lesinurad  medicines for diabetes  medicines  to treat fungal infections, such as griseofulvin, miconazole, fluconazole, ketoconazole, itraconazole, posaconazole or voriconazole  mifepristone  mitotane  modafinil  morphine  mycophenolate  St. John's wort  tamoxifen  temazepam  theophylline or aminophylline  thyroid hormones  tizanidine  tranexamic acid  ulipristal  warfarin This list may not describe all possible interactions. Give your health care provider a list of all the medicines, herbs, non-prescription drugs, or dietary supplements you use. Also tell them if you smoke, drink alcohol, or use illegal drugs. Some items may interact with your medicine. What should I watch for while using this medicine? Visit your doctor or health care professional for regular checks on your progress. You will need a regular breast and pelvic exam and Pap smear while on this medicine. Check with your doctor or health care professional to see if you need an additional method of contraception during the first cycle that you use this ring. Female condoms (made with natural rubber latex, polyisoprene, and polyurethane) and spermicides may be used. Do not use a diaphragm, cervical cap, or a female condom, as the ring can interfere with these birth control methods and their proper placement. If you have any reason to think you are pregnant, stop using this medicine right away and contact your doctor or health care professional. If you are using this medicine for hormone related problems, it may take several cycles of use to see improvement in your condition. Smoking increases the risk of getting a blood clot or having a stroke while you are using hormonal birth control, especially if you are more than 26 years old. You are strongly advised not to smoke. Some women are prone to getting dark patches on the skin of the face (cholasma). Your risk of getting chloasma with this medicine is higher if you had chloasma during a pregnancy. Keep out of the  sun. If you cannot avoid being in the sun, wear protective clothing and use sunscreen. Do not use sun lamps or tanning beds/booths. This medicine can make your body retain fluid, making your fingers, hands, or ankles swell. Your blood pressure can go up. Contact your doctor or health care professional if you feel you are retaining fluid. If you are going to have elective surgery, you may need to stop using this medicine before the surgery. Consult your health care professional for advice. This medicine does not protect you against HIV infection (AIDS) or any other sexually transmitted diseases. What side effects may I notice from receiving this medicine? Side effects that you should report to your doctor or health care professional as soon as possible:  allergic reactions such as skin rash or itching, hives, swelling of the lips, mouth, tongue, or throat  depression  high blood pressure  migraines or severe, sudden headaches  signs and symptoms of a blood clot such as breathing problems; changes in vision; chest pain; severe, sudden headache; pain, swelling, warmth in the leg; trouble speaking; sudden numbness or weakness of the face, arm or leg  signs and symptoms of infection like fever or chills with dizziness and a sunburn-like rash, or pain or trouble passing urine  stomach pain  symptoms of vaginal infection like itching, irritation or unusual discharge  yellowing of the eyes or skin Side effects that usually do not require medical attention (report these to your doctor   or health care professional if they continue or are bothersome):  acne  breast pain, tenderness  irregular vaginal bleeding or spotting, particularly during the first month of use  mild headache  nausea  painful periods  vomiting This list may not describe all possible side effects. Call your doctor for medical advice about side effects. You may report side effects to FDA at 1-800-FDA-1088. Where should I  keep my medicine? Keep out of the reach of children. Store unopened medicine for up to 4 months at room temperature at 15 and 30 degrees C (59 and 86 degrees F). Protect from light. Do not store above 30 degrees C (86 degrees F). Throw away any unused medicine 4 months after the dispense date or the expiration date, whichever comes first. A ring may only be used for 1 cycle (1 month). After the 3-week cycle, a used ring is removed and should be placed in the re-closable foil pouch and discarded in the trash out of reach of children and pets. Do NOT flush down the toilet. NOTE: This sheet is a summary. It may not cover all possible information. If you have questions about this medicine, talk to your doctor, pharmacist, or health care provider.  2020 Elsevier/Gold Standard (2018-11-19 12:31:47)  

## 2019-10-19 LAB — CYTOLOGY - PAP: Diagnosis: NEGATIVE

## 2019-10-29 ENCOUNTER — Other Ambulatory Visit: Payer: Self-pay | Admitting: Obstetrics and Gynecology

## 2019-10-29 DIAGNOSIS — Z3009 Encounter for other general counseling and advice on contraception: Secondary | ICD-10-CM

## 2019-10-29 MED ORDER — NORETHIN ACE-ETH ESTRAD-FE 1-20 MG-MCG PO TABS: 1.0000 | ORAL_TABLET | Freq: Every day | ORAL | 11 refills | Status: DC

## 2019-11-11 DIAGNOSIS — Z419 Encounter for procedure for purposes other than remedying health state, unspecified: Secondary | ICD-10-CM | POA: Diagnosis not present

## 2019-12-12 DIAGNOSIS — Z419 Encounter for procedure for purposes other than remedying health state, unspecified: Secondary | ICD-10-CM | POA: Diagnosis not present

## 2020-01-12 DIAGNOSIS — Z419 Encounter for procedure for purposes other than remedying health state, unspecified: Secondary | ICD-10-CM | POA: Diagnosis not present

## 2020-02-09 ENCOUNTER — Other Ambulatory Visit: Payer: Medicaid Other

## 2020-02-09 DIAGNOSIS — Z20822 Contact with and (suspected) exposure to covid-19: Secondary | ICD-10-CM

## 2020-02-10 LAB — NOVEL CORONAVIRUS, NAA: SARS-CoV-2, NAA: DETECTED — AB

## 2020-02-10 LAB — SARS-COV-2, NAA 2 DAY TAT

## 2020-02-11 ENCOUNTER — Telehealth: Payer: Self-pay | Admitting: Infectious Diseases

## 2020-02-11 DIAGNOSIS — Z419 Encounter for procedure for purposes other than remedying health state, unspecified: Secondary | ICD-10-CM | POA: Diagnosis not present

## 2020-02-11 NOTE — Telephone Encounter (Signed)
Call went straight to VM and unable to leave message. Patient would like to be scheduled for MAB

## 2020-03-13 DIAGNOSIS — Z419 Encounter for procedure for purposes other than remedying health state, unspecified: Secondary | ICD-10-CM | POA: Diagnosis not present

## 2020-04-12 DIAGNOSIS — Z419 Encounter for procedure for purposes other than remedying health state, unspecified: Secondary | ICD-10-CM | POA: Diagnosis not present

## 2020-05-13 DIAGNOSIS — Z419 Encounter for procedure for purposes other than remedying health state, unspecified: Secondary | ICD-10-CM | POA: Diagnosis not present

## 2020-06-13 DIAGNOSIS — Z419 Encounter for procedure for purposes other than remedying health state, unspecified: Secondary | ICD-10-CM | POA: Diagnosis not present

## 2020-07-11 DIAGNOSIS — Z419 Encounter for procedure for purposes other than remedying health state, unspecified: Secondary | ICD-10-CM | POA: Diagnosis not present
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
## 2020-08-11 DIAGNOSIS — Z419 Encounter for procedure for purposes other than remedying health state, unspecified: Secondary | ICD-10-CM | POA: Diagnosis not present

## 2020-09-10 DIAGNOSIS — Z419 Encounter for procedure for purposes other than remedying health state, unspecified: Secondary | ICD-10-CM | POA: Diagnosis not present

## 2020-10-11 DIAGNOSIS — Z419 Encounter for procedure for purposes other than remedying health state, unspecified: Secondary | ICD-10-CM | POA: Diagnosis not present

## 2020-11-10 DIAGNOSIS — Z419 Encounter for procedure for purposes other than remedying health state, unspecified: Secondary | ICD-10-CM | POA: Diagnosis not present

## 2020-12-11 DIAGNOSIS — Z419 Encounter for procedure for purposes other than remedying health state, unspecified: Secondary | ICD-10-CM | POA: Diagnosis not present

## 2021-01-11 DIAGNOSIS — Z419 Encounter for procedure for purposes other than remedying health state, unspecified: Secondary | ICD-10-CM | POA: Diagnosis not present

## 2021-02-10 DIAGNOSIS — Z419 Encounter for procedure for purposes other than remedying health state, unspecified: Secondary | ICD-10-CM | POA: Diagnosis not present

## 2021-03-13 DIAGNOSIS — Z419 Encounter for procedure for purposes other than remedying health state, unspecified: Secondary | ICD-10-CM | POA: Diagnosis not present

## 2021-04-12 DIAGNOSIS — Z419 Encounter for procedure for purposes other than remedying health state, unspecified: Secondary | ICD-10-CM | POA: Diagnosis not present

## 2021-05-13 DIAGNOSIS — Z419 Encounter for procedure for purposes other than remedying health state, unspecified: Secondary | ICD-10-CM | POA: Diagnosis not present

## 2021-06-13 DIAGNOSIS — Z419 Encounter for procedure for purposes other than remedying health state, unspecified: Secondary | ICD-10-CM | POA: Diagnosis not present

## 2021-07-11 DIAGNOSIS — Z419 Encounter for procedure for purposes other than remedying health state, unspecified: Secondary | ICD-10-CM | POA: Diagnosis not present

## 2021-08-11 DIAGNOSIS — Z419 Encounter for procedure for purposes other than remedying health state, unspecified: Secondary | ICD-10-CM | POA: Diagnosis not present

## 2021-09-10 DIAGNOSIS — Z419 Encounter for procedure for purposes other than remedying health state, unspecified: Secondary | ICD-10-CM | POA: Diagnosis not present

## 2021-10-11 DIAGNOSIS — Z419 Encounter for procedure for purposes other than remedying health state, unspecified: Secondary | ICD-10-CM | POA: Diagnosis not present

## 2021-11-10 DIAGNOSIS — Z419 Encounter for procedure for purposes other than remedying health state, unspecified: Secondary | ICD-10-CM | POA: Diagnosis not present

## 2021-12-11 DIAGNOSIS — Z419 Encounter for procedure for purposes other than remedying health state, unspecified: Secondary | ICD-10-CM | POA: Diagnosis not present

## 2022-01-11 DIAGNOSIS — Z419 Encounter for procedure for purposes other than remedying health state, unspecified: Secondary | ICD-10-CM | POA: Diagnosis not present

## 2022-02-10 DIAGNOSIS — Z419 Encounter for procedure for purposes other than remedying health state, unspecified: Secondary | ICD-10-CM | POA: Diagnosis not present

## 2022-03-13 DIAGNOSIS — Z419 Encounter for procedure for purposes other than remedying health state, unspecified: Secondary | ICD-10-CM | POA: Diagnosis not present

## 2022-04-04 DIAGNOSIS — H1032 Unspecified acute conjunctivitis, left eye: Secondary | ICD-10-CM | POA: Diagnosis not present

## 2022-04-12 DIAGNOSIS — Z419 Encounter for procedure for purposes other than remedying health state, unspecified: Secondary | ICD-10-CM | POA: Diagnosis not present

## 2022-05-13 DIAGNOSIS — Z419 Encounter for procedure for purposes other than remedying health state, unspecified: Secondary | ICD-10-CM | POA: Diagnosis not present

## 2022-06-13 DIAGNOSIS — Z419 Encounter for procedure for purposes other than remedying health state, unspecified: Secondary | ICD-10-CM | POA: Diagnosis not present

## 2022-07-12 DIAGNOSIS — Z419 Encounter for procedure for purposes other than remedying health state, unspecified: Secondary | ICD-10-CM | POA: Diagnosis not present

## 2022-08-06 ENCOUNTER — Encounter: Payer: Self-pay | Admitting: Advanced Practice Midwife

## 2022-08-06 ENCOUNTER — Other Ambulatory Visit (HOSPITAL_COMMUNITY)
Admission: RE | Admit: 2022-08-06 | Discharge: 2022-08-06 | Disposition: A | Discharge status: Home / Self Care | Payer: Medicaid Other | Source: Ambulatory Visit | Attending: Advanced Practice Midwife | Admitting: Advanced Practice Midwife

## 2022-08-06 ENCOUNTER — Ambulatory Visit (INDEPENDENT_AMBULATORY_CARE_PROVIDER_SITE_OTHER): Payer: Medicaid Other | Admitting: Advanced Practice Midwife

## 2022-08-06 VITALS — BP 100/60 | Ht 65.0 in | Wt 219.0 lb

## 2022-08-06 DIAGNOSIS — Z01419 Encounter for gynecological examination (general) (routine) without abnormal findings: Secondary | ICD-10-CM

## 2022-08-06 DIAGNOSIS — Z20828 Contact with and (suspected) exposure to other viral communicable diseases: Secondary | ICD-10-CM

## 2022-08-06 DIAGNOSIS — Z113 Encounter for screening for infections with a predominantly sexual mode of transmission: Secondary | ICD-10-CM | POA: Insufficient documentation

## 2022-08-06 DIAGNOSIS — Z1159 Encounter for screening for other viral diseases: Secondary | ICD-10-CM

## 2022-08-06 DIAGNOSIS — Z124 Encounter for screening for malignant neoplasm of cervix: Secondary | ICD-10-CM

## 2022-08-08 ENCOUNTER — Encounter: Payer: Self-pay | Admitting: Advanced Practice Midwife

## 2022-08-08 LAB — HEPATITIS B SURFACE ANTIBODY,QUALITATIVE: Hep B Surface Ab, Qual: NONREACTIVE

## 2022-08-08 LAB — HIV ANTIBODY (ROUTINE TESTING W REFLEX): HIV Screen 4th Generation wRfx: NONREACTIVE

## 2022-08-08 LAB — HEPATITIS C ANTIBODY: Hep C Virus Ab: NONREACTIVE

## 2022-08-08 LAB — CMV ABS, IGG+IGM (CYTOMEGALOVIRUS)
CMV Ab - IgG: >10 U/mL — ABNORMAL HIGH (ref 0.00–0.59)
CMV IgM Ser EIA-aCnc: <30 AU/mL (ref 0.0–29.9)

## 2022-08-08 LAB — CYTOLOGY - PAP
Chlamydia: NEGATIVE
Comment: NEGATIVE
Comment: NEGATIVE
Comment: NORMAL
Diagnosis: NEGATIVE
Neisseria Gonorrhea: NEGATIVE
Trichomonas: NEGATIVE

## 2022-08-08 LAB — EPSTEIN-BARR VIRUS NUCLEAR ANTIGEN ANTIBODY, IGG: EBV NA IgG: 33.6 U/mL — ABNORMAL HIGH (ref 0.0–17.9)

## 2022-08-08 LAB — RPR QUALITATIVE: RPR Ser Ql: NONREACTIVE

## 2022-08-08 NOTE — Patient Instructions (Signed)
Healthy Living: Tips to Quit Tobacco There are many benefits of not using tobacco. You will learn tips you can use to stop using tobacco. To view the content, go to this web address: https://pe.elsevier.com/U7Rz4ZSY  This video will expire on: 04/24/2024. If you need access to this video following this date, please reach out to the healthcare provider who assigned it to you. This information is not intended to replace advice given to you by your health care provider. Make sure you discuss any questions you have with your health care provider. Elsevier Patient Education  [place] of Quitting Smoking Quitting smoking is a physical and mental challenge. You may have cravings, withdrawal symptoms, and temptation to smoke. Before quitting, work with your health care provider to make a plan that can help you manage quitting. Making a plan before you quit may keep you from smoking when you have the urge to smoke while trying to quit. How to manage lifestyle changes Managing stress Stress can make you want to smoke, and wanting to smoke may cause stress. It is important to find ways to manage your stress. You could try some of the following: Practice relaxation techniques. Breathe slowly and deeply, in through your nose and out through your mouth. Listen to music. Soak in a bath or take a shower. Imagine a peaceful place or vacation. Get some support. Talk with family or friends about your stress. Join a support group. Talk with a counselor or therapist. Get some physical activity. Go for a walk, run, or bike ride. Play a favorite sport. Practice yoga.  Medicines Talk with your health care provider about medicines that might help you deal with cravings and make quitting easier for you. Relationships Social situations can be difficult when you are quitting smoking. To manage this, you can: Avoid parties and other social situations where people might be  smoking. Avoid alcohol. Leave right away if you have the urge to smoke. Explain to your family and friends that you are quitting smoking. Ask for support and let them know you might be a bit grumpy. Plan activities where smoking is not an option. General instructions Be aware that many people gain weight after they quit smoking. However, not everyone does. To keep from gaining weight, have a plan in place before you quit, and stick to the plan after you quit. Your plan should include: Eating healthy snacks. When you have a craving, it may help to: Eat popcorn, or try carrots, celery, or other cut vegetables. Chew sugar-free gum. Changing how you eat. Eat small portion sizes at meals. Eat 4-6 small meals throughout the day instead of 1-2 large meals a day. Be mindful when you eat. You should avoid watching television or doing other things that might distract you as you eat. Exercising regularly. Make time to exercise each day. If you do not have time for a long workout, do short bouts of exercise for 5-10 minutes several times a day. Do some form of strengthening exercise, such as weight lifting. Do some exercise that gets your heart beating and causes you to breathe deeply, such as walking fast, running, swimming, or biking. This is very important. Drinking plenty of water or other low-calorie or no-calorie drinks. Drink enough fluid to keep your urine pale yellow.  How to recognize withdrawal symptoms Your body and mind may experience discomfort as you try to get used to not having nicotine in your system. These effects are called withdrawal symptoms. They may  include: Feeling hungrier than normal. Having trouble concentrating. Feeling irritable or restless. Having trouble sleeping. Feeling depressed. Craving a cigarette. These symptoms may surprise you, but they are normal to have when quitting smoking. To manage withdrawal symptoms: Avoid places, people, and activities that trigger  your cravings. Remember why you want to quit. Get plenty of sleep. Avoid coffee and other drinks that contain caffeine. These may worsen some of your symptoms. How to manage cravings Come up with a plan for how to deal with your cravings. The plan should include the following: A definition of the specific situation you want to deal with. An activity or action you will take to replace smoking. A clear idea for how this action will help. The name of someone who could help you with this. Cravings usually last for 5-10 minutes. Consider taking the following actions to help you with your plan to deal with cravings: Keep your mouth busy. Chew sugar-free gum. Suck on hard candies or a straw. Brush your teeth. Keep your hands and body busy. Change to a different activity right away. Squeeze or play with a ball. Do an activity or a hobby, such as making bead jewelry, practicing needlepoint, or working with wood. Mix up your normal routine. Take a short exercise break. Go for a quick walk, or run up and down stairs. Focus on doing something kind or helpful for someone else. Call a friend or family member to talk during a craving. Join a support group. Contact a quitline. Where to find support To get help or find a support group: Call the [facility]'s Smoking Quitline: 1-800-QUIT-NOW (513)452-3641) Text QUIT to SmokefreeTXTAZ:4618977 Where to find more information Visit these websites to find more information on quitting smoking: U.S. Department of Health and Human Services: www.smokefree.gov American Lung Association: www.freedomfromsmoking.org Centers for Disease Control and Prevention (CDC): http://www.wolf.info/ American Heart Association: www.heart.org Contact a health care provider if: You want to change your plan for quitting. The medicines you are taking are not helping. Your eating feels out of control or you cannot sleep. You feel depressed or become very  anxious. Summary Quitting smoking is a physical and mental challenge. You will face cravings, withdrawal symptoms, and temptation to smoke again. Preparation can help you as you go through these challenges. Try different techniques to manage stress, handle social situations, and prevent weight gain. You can deal with cravings by keeping your mouth busy (such as by chewing gum), keeping your hands and body busy, calling family or friends, or contacting a quitline for people who want to quit smoking. You can deal with withdrawal symptoms by avoiding places where people smoke, getting plenty of rest, and avoiding drinks that contain caffeine. This information is not intended to replace advice given to you by your health care provider. Make sure you discuss any questions you have with your health care provider. Document Revised: 04/20/2021 Document Reviewed: 04/20/2021 Elsevier Patient Education  [place].

## 2022-08-08 NOTE — Progress Notes (Signed)
[facility]   Gynecology Annual Exam   PCP: Patient, No Pcp Per  Chief Complaint:  Chief Complaint  Patient presents with   Annual Exam    History of Present Illness: Patient is a 27 y.o. [id number] presents for annual exam. The patient has concerns regarding possible exposure to [place] Patient Virus and Cytomegalovirus. Her partner has been ill for the past year with liver disease and has been hospitalized several times since then. He has tested positive for those 2 viruses. She denies any s/s of illness and does request testing. She denies any gyn concerns.  LMP: Patient's last menstrual period was 07/09/2022. Average Interval: regular, 28 days Duration of flow:  4-5  days Heavy Menses: first few days Clots: no Intermenstrual Bleeding: no Postcoital Bleeding: no Dysmenorrhea: no  The patient is not currently sexually active. She currently uses abstinence for contraception. She denies dyspareunia.  The patient does perform self breast exams.  There  is possibly  notable family history of breast or ovarian cancer in her family. [facility] telephone number given for patient to discuss significance of paternal grandmother with breast cancer at less than 59 years old.  The patient wears seatbelts: yes.   The patient has regular exercise: no.  She admits generally healthy diet and adequate hydration and sleep. She had quit smoking, however, has restarted in the past month. Smoking cessation encouraged.  The patient denies current symptoms of depression.    Review of Systems: Review of Systems  Constitutional:  Negative for chills and fever.  HENT:  Negative for congestion, ear discharge, ear pain, hearing loss, sinus pain and sore throat.   Eyes:  Negative for blurred vision and double vision.  Respiratory:  Negative for cough, shortness of breath and wheezing.   Cardiovascular:  Negative for chest pain, palpitations and leg swelling.  Gastrointestinal:  Negative for abdominal pain, blood  in stool, constipation, diarrhea, heartburn, melena, nausea and vomiting.  Genitourinary:  Negative for dysuria, flank pain, frequency, hematuria and urgency.  Musculoskeletal:  Negative for back pain, joint pain and myalgias.  Skin:  Negative for itching and rash.  Neurological:  Negative for dizziness, tingling, tremors, sensory change, speech change, focal weakness, seizures, loss of consciousness, weakness and headaches.  Endo/Heme/Allergies:  Negative for environmental allergies. Does not bruise/bleed easily.  Psychiatric/Behavioral:  Negative for depression, hallucinations, memory loss, substance abuse and suicidal ideas. The patient is not nervous/anxious and does not have insomnia.     Past Medical History:  Patient Active Problem List   Diagnosis Date Noted   BMI 36.0-36.9,adult 02/08/2019    Past Surgical History:  Past Surgical History:  Procedure Laterality Date   COLPOSCOPY  10/28/2018   NO PAST SURGERIES      Gynecologic History:  Patient's last menstrual period was 07/09/2022. Contraception: abstinence Last Pap: 2021 Results were: no abnormalities   Obstetric History: [id number]  Family History:  Family History  Problem Relation Age of Onset   Schizophrenia Brother    Breast cancer Paternal Grandmother    Cancer Maternal Grandfather 31       COLON   Cancer Other    Diabetes Other        TYPE 2   Depression Other     Social History:  Social History   Socioeconomic History   Marital status: Single    Spouse name: Not on file   Number of children: 1   Years of education: 12   Highest education level: Not on file  Occupational History   Occupation: UNEMPLOYED  Tobacco Use   Smoking status: Former    Packs/day: 0.25    Years: 5.00    Additional pack years: 0.00    Total pack years: 1.25    Types: Cigarettes    Quit date: 09/30/2014    Years since quitting: 7.8   Smokeless tobacco: Never  Vaping Use   Vaping Use: Never used  Substance and Sexual  Activity   Alcohol use: No    Alcohol/week: 0.0 standard drinks of alcohol   Drug use: Yes    Types: Marijuana    Comment: positive test this pregnancy   Sexual activity: Yes    Birth control/protection: None  Other Topics Concern   Not on file  Social History Narrative   Not on file   Social Determinants of Health   Financial Resource Strain: Not on file  Food Insecurity: Not on file  Transportation Needs: Not on file  Physical Activity: Not on file  Stress: Not on file  Social Connections: Not on file  Intimate Partner Violence: Not on file    Allergies:  Allergies  Allergen Reactions   Amoxicillin Hives   Penicillin G Hives    Medications: Prior to Admission medications   Not on File    Physical Exam Vitals: Blood pressure 100/60, height 5\' 5"  (1.651 m), weight 219 lb (99.3 kg), last menstrual period 07/09/2022.  General: NAD HEENT: normocephalic, anicteric Thyroid: no enlargement, no palpable nodules Pulmonary: No increased work of breathing, CTAB Cardiovascular: RRR, distal pulses 2+ Breast: Breast symmetrical, no tenderness, no palpable nodules or masses, no skin or nipple retraction present, no nipple discharge.  No axillary or supraclavicular lymphadenopathy. Abdomen: NABS, soft, non-tender, non-distended.  Umbilicus without lesions.  No hepatomegaly, splenomegaly or masses palpable. No evidence of hernia  Genitourinary:  External: Normal external female genitalia.  Normal urethral meatus, normal Bartholin's and Skene's glands.    Vagina: Normal vaginal mucosa, no evidence of prolapse.    Cervix: Grossly normal in appearance, no bleeding  Uterus: Non-enlarged, mobile, normal contour.  No CMT  Adnexa: ovaries non-enlarged, no adnexal masses  Rectal: deferred  Lymphatic: no evidence of inguinal lymphadenopathy Extremities: no edema, erythema, or tenderness Neurologic: Grossly intact Psychiatric: mood appropriate, affect full   Assessment: 27 y.o.  G2P1102 routine annual exam  Plan: Problem List Items Addressed This Visit   None Visit Diagnoses     Well woman exam with routine gynecological exam    -  Primary   Relevant Orders   Epstein-Barr virus nuclear antigen antibody, IgG (Completed)   CMV abs, IgG+IgM (cytomegalovirus) (Completed)   Cytology - PAP   Hepatitis B surface antibody,qualitative (Completed)   Hepatitis C antibody (Completed)   HIV Antibody (routine testing w rflx) (Completed)   RPR Qual (Completed)   Need for hepatitis B screening test       Relevant Orders   Hepatitis B surface antibody,qualitative (Completed)   Need for hepatitis C screening test       Relevant Orders   Hepatitis C antibody (Completed)   Screen for sexually transmitted diseases       Relevant Orders   Cytology - PAP   Hepatitis B surface antibody,qualitative (Completed)   Hepatitis C antibody (Completed)   HIV Antibody (routine testing w rflx) (Completed)   RPR Qual (Completed)   Exposure to Epstein-Barr virus       Relevant Orders   Epstein-Barr virus nuclear antigen antibody, IgG (Completed)   Exposure to cytomegalovirus (  CMV)       Relevant Orders   CMV abs, IgG+IgM (cytomegalovirus) (Completed)   Screening for cervical cancer       Relevant Orders   Cytology - PAP       1) STI screening  was offered and accepted  2)  ASCCP guidelines and rationale discussed.  Patient opts for every 3 years screening interval  3) Contraception - the patient is currently using  abstinence.  She is happy with her current form of contraception and plans to continue  4) Routine healthcare maintenance including cholesterol, diabetes screening discussed Declines  5) Return in about 1 year (around 08/06/2023) for annual established gyn.   Rod Can, [facility] 08/08/2022, 10:52 AM
